# Patient Record
Sex: Female | Born: 1965 | Race: White | Hispanic: No | Marital: Married | Smoking: Never smoker
Health system: Southern US, Community
[De-identification: ages and names within clinical notes are randomized; demographics above are authoritative.]

## PROBLEM LIST (undated history)

## (undated) DIAGNOSIS — M1990 Unspecified osteoarthritis, unspecified site: Secondary | ICD-10-CM

## (undated) DIAGNOSIS — D649 Anemia, unspecified: Secondary | ICD-10-CM

## (undated) DIAGNOSIS — Z8489 Family history of other specified conditions: Secondary | ICD-10-CM

## (undated) DIAGNOSIS — D219 Benign neoplasm of connective and other soft tissue, unspecified: Secondary | ICD-10-CM

---

## 1988-04-22 HISTORY — PX: OTHER SURGICAL HISTORY: SHX169

## 2004-01-29 ENCOUNTER — Emergency Department: Payer: Self-pay | Admitting: Emergency Medicine

## 2004-05-11 ENCOUNTER — Ambulatory Visit: Payer: Self-pay | Admitting: Unknown Physician Specialty

## 2005-01-22 ENCOUNTER — Ambulatory Visit: Payer: Self-pay | Admitting: Unknown Physician Specialty

## 2005-02-06 ENCOUNTER — Ambulatory Visit: Payer: Self-pay | Admitting: Unknown Physician Specialty

## 2006-02-12 ENCOUNTER — Ambulatory Visit: Payer: Self-pay

## 2007-08-20 ENCOUNTER — Encounter: Payer: Self-pay | Admitting: Family Medicine

## 2007-08-21 ENCOUNTER — Encounter: Payer: Self-pay | Admitting: Family Medicine

## 2009-10-02 ENCOUNTER — Emergency Department: Payer: Self-pay | Admitting: Emergency Medicine

## 2010-04-05 ENCOUNTER — Ambulatory Visit: Payer: Self-pay

## 2010-08-06 ENCOUNTER — Ambulatory Visit: Payer: Self-pay | Admitting: Internal Medicine

## 2011-06-06 ENCOUNTER — Ambulatory Visit: Payer: Self-pay

## 2012-04-22 HISTORY — PX: ESOPHAGOGASTRODUODENOSCOPY: SHX1529

## 2012-04-22 HISTORY — PX: CHOLECYSTECTOMY: SHX55

## 2012-06-09 ENCOUNTER — Ambulatory Visit: Payer: Self-pay

## 2012-07-16 ENCOUNTER — Ambulatory Visit: Payer: Self-pay | Admitting: Specialist

## 2012-07-21 ENCOUNTER — Ambulatory Visit: Payer: Self-pay | Admitting: Specialist

## 2012-07-21 LAB — CBC WITH DIFFERENTIAL/PLATELET
Basophil #: 0 10*3/uL (ref 0.0–0.1)
Basophil %: 0.2 %
Eosinophil #: 0.2 10*3/uL (ref 0.0–0.7)
Eosinophil %: 3.1 %
HCT: 40.6 % (ref 35.0–47.0)
HGB: 13.9 g/dL (ref 12.0–16.0)
Lymphocyte #: 1.4 10*3/uL (ref 1.0–3.6)
Lymphocyte %: 22.9 %
MCH: 30.1 pg (ref 26.0–34.0)
MCHC: 34.4 g/dL (ref 32.0–36.0)
MCV: 88 fL (ref 80–100)
Monocyte #: 0.4 x10 3/mm (ref 0.2–0.9)
Monocyte %: 6.5 %
Neutrophil #: 4.1 10*3/uL (ref 1.4–6.5)
Neutrophil %: 67.3 %
Platelet: 334 10*3/uL (ref 150–440)
RBC: 4.63 10*6/uL (ref 3.80–5.20)
RDW: 13.6 % (ref 11.5–14.5)
WBC: 6.2 10*3/uL (ref 3.6–11.0)

## 2012-07-21 LAB — PROTIME-INR
INR: 1
Prothrombin Time: 13.4 secs (ref 11.5–14.7)

## 2012-07-21 LAB — IRON AND TIBC
Iron Bind.Cap.(Total): 319 ug/dL (ref 250–450)
Iron Saturation: 27 %
Iron: 86 ug/dL (ref 50–170)
Unbound Iron-Bind.Cap.: 233 ug/dL

## 2012-07-21 LAB — COMPREHENSIVE METABOLIC PANEL
Albumin: 3.5 g/dL (ref 3.4–5.0)
Alkaline Phosphatase: 96 U/L (ref 50–136)
Anion Gap: 5 — ABNORMAL LOW (ref 7–16)
BUN: 11 mg/dL (ref 7–18)
Bilirubin,Total: 0.5 mg/dL (ref 0.2–1.0)
Calcium, Total: 9 mg/dL (ref 8.5–10.1)
Chloride: 103 mmol/L (ref 98–107)
Co2: 29 mmol/L (ref 21–32)
Creatinine: 0.84 mg/dL (ref 0.60–1.30)
EGFR (African American): >60
EGFR (Non-African Amer.): >60
Glucose: 98 mg/dL (ref 65–99)
Osmolality: 273 (ref 275–301)
Potassium: 4 mmol/L (ref 3.5–5.1)
SGOT(AST): 15 U/L (ref 15–37)
SGPT (ALT): 20 U/L (ref 12–78)
Sodium: 137 mmol/L (ref 136–145)
Total Protein: 7.3 g/dL (ref 6.4–8.2)

## 2012-07-21 LAB — MAGNESIUM: Magnesium: 1.6 mg/dL — ABNORMAL LOW

## 2012-07-21 LAB — AMYLASE: Amylase: 31 U/L (ref 25–115)

## 2012-07-21 LAB — PHOSPHORUS: Phosphorus: 3.6 mg/dL (ref 2.5–4.9)

## 2012-07-21 LAB — FOLATE: Folic Acid: 36.9 ng/mL (ref 3.1–100.0)

## 2012-07-21 LAB — APTT: Activated PTT: 28.8 secs (ref 23.6–35.9)

## 2012-07-21 LAB — LIPASE, BLOOD: Lipase: 90 U/L (ref 73–393)

## 2012-07-21 LAB — HCG, QUANTITATIVE, PREGNANCY: Beta Hcg, Quant.: <1 m[IU]/mL — ABNORMAL LOW

## 2012-07-21 LAB — BILIRUBIN, DIRECT: Bilirubin, Direct: 0.1 mg/dL (ref 0.00–0.20)

## 2012-07-21 LAB — FERRITIN: Ferritin (ARMC): 14 ng/mL (ref 8–388)

## 2012-07-21 LAB — HEMOGLOBIN A1C: Hemoglobin A1C: 5.7 % (ref 4.2–6.3)

## 2012-07-21 LAB — TSH: Thyroid Stimulating Horm: 2.48 u[IU]/mL

## 2012-08-20 ENCOUNTER — Ambulatory Visit: Payer: Self-pay | Admitting: Surgery

## 2012-08-21 LAB — PATHOLOGY REPORT

## 2012-09-04 ENCOUNTER — Ambulatory Visit: Payer: Self-pay | Admitting: Specialist

## 2012-12-07 ENCOUNTER — Ambulatory Visit: Payer: Self-pay | Admitting: Gastroenterology

## 2012-12-08 LAB — PATHOLOGY REPORT

## 2013-01-13 ENCOUNTER — Ambulatory Visit: Payer: Self-pay | Admitting: Specialist

## 2013-01-13 LAB — MAGNESIUM: Magnesium: 2 mg/dL

## 2013-01-19 ENCOUNTER — Inpatient Hospital Stay: Payer: Self-pay | Admitting: Specialist

## 2013-01-19 LAB — TROPONIN I: Troponin-I: <0.02 ng/mL

## 2013-01-19 LAB — CK: CK, Total: 134 U/L (ref 21–215)

## 2013-01-19 LAB — CK-MB: CK-MB: 1.7 ng/mL (ref 0.5–3.6)

## 2013-01-20 LAB — CBC WITH DIFFERENTIAL/PLATELET
Basophil #: 0 10*3/uL (ref 0.0–0.1)
Basophil %: 0.2 %
Eosinophil #: 0 10*3/uL (ref 0.0–0.7)
Eosinophil %: 0.1 %
HCT: 37.6 % (ref 35.0–47.0)
HGB: 12.4 g/dL (ref 12.0–16.0)
Lymphocyte #: 1.3 10*3/uL (ref 1.0–3.6)
Lymphocyte %: 9.3 %
MCH: 28.6 pg (ref 26.0–34.0)
MCHC: 32.9 g/dL (ref 32.0–36.0)
MCV: 87 fL (ref 80–100)
Monocyte #: 0.9 x10 3/mm (ref 0.2–0.9)
Monocyte %: 6.4 %
Neutrophil #: 11.4 10*3/uL — ABNORMAL HIGH (ref 1.4–6.5)
Neutrophil %: 84 %
Platelet: 340 10*3/uL (ref 150–440)
RBC: 4.32 10*6/uL (ref 3.80–5.20)
RDW: 15.8 % — ABNORMAL HIGH (ref 11.5–14.5)
WBC: 13.6 10*3/uL — ABNORMAL HIGH (ref 3.6–11.0)

## 2013-01-20 LAB — BASIC METABOLIC PANEL
Anion Gap: 8 (ref 7–16)
BUN: 9 mg/dL (ref 7–18)
Calcium, Total: 7.7 mg/dL — ABNORMAL LOW (ref 8.5–10.1)
Chloride: 111 mmol/L — ABNORMAL HIGH (ref 98–107)
Co2: 21 mmol/L (ref 21–32)
Creatinine: 0.71 mg/dL (ref 0.60–1.30)
EGFR (African American): >60
EGFR (Non-African Amer.): >60
Glucose: 95 mg/dL (ref 65–99)
Osmolality: 278 (ref 275–301)
Potassium: 4.2 mmol/L (ref 3.5–5.1)
Sodium: 140 mmol/L (ref 136–145)

## 2013-01-20 LAB — MAGNESIUM: Magnesium: 1.7 mg/dL — ABNORMAL LOW

## 2013-01-20 LAB — PHOSPHORUS: Phosphorus: 2.7 mg/dL (ref 2.5–4.9)

## 2013-01-20 LAB — ALBUMIN: Albumin: 2.9 g/dL — ABNORMAL LOW (ref 3.4–5.0)

## 2013-02-11 ENCOUNTER — Ambulatory Visit: Payer: Self-pay | Admitting: Specialist

## 2013-04-22 HISTORY — PX: GASTRIC BYPASS: SHX52

## 2013-06-11 ENCOUNTER — Ambulatory Visit: Payer: Self-pay

## 2014-04-08 ENCOUNTER — Ambulatory Visit: Payer: Self-pay | Admitting: Obstetrics and Gynecology

## 2014-08-12 NOTE — Op Note (Signed)
PATIENT NAME:  Annette Aguilar, Annette Aguilar MR#:  [id number] DATE OF BIRTH:  Jul 30, 1965  DATE OF PROCEDURE:  01/19/2013  PREOPERATIVE DIAGNOSIS:  Morbid obesity.   POSTOPERATIVE DIAGNOSIS:  Morbid obesity.   PROCEDURE:  Laparoscopic Roux-en-Y gastric bypass.   SURGEON:  Kreg Shropshire, M.D.  ASSISTANT:  Valaria Good, PA-C.  COMPLICATIONS:  None.   ANESTHESIA:  General endotracheal.   FINDINGS:  No significant hiatal hernia.   CLINICAL HISTORY:  See H and P.   DETAILS OF THE PROCEDURE:  The patient was taken to the operating room and placed on the operating table in the supine position.  Appropriate monitors and supplemental oxygen were delivered.  Broad-spectrum IV antibiotics were administered.  Sequential stockings were placed.  The abdomen was prepped and draped in the usual sterile fashion after the smooth induction of general anesthesia.  The abdomen was accessed using a 5-ml optical trocar in the left upper quadrant.  Pneumoperitoneum was established without difficulty.  Multiple other ports were placed in preparation for gastric bypass.  The small bowel was identified at the ligament of Treitz and run for 50 cm distal to that point.  It was then divided using an Echelon white load stapler reinforced with seam guard.  The Harmonic scalpel was used to take down the mesentery to its base.  A 100 cm Roux limb, 50 cm biliopancreatic limb was then created and a side-to-side jejunojejunostomy was created in the following fashion.  A tacking stitch was used using 2-0 Surgidac suture.  An enterotomy was made in both limbs and the Echelon white load stapler was inserted to its hub, clamped, and fired for its full 6-cm length.  The ensuing defect was closed using interrupted sutures to retract the enterotomy up and stapled across with the Echelon blue-load stapler with good effect.  A baby's butt stitch was placed times one using 2-0 Surgidac suture.  A stay suture was placed anteriorly using a 2-0 Surgidac suture and  the mesenteric defect was closed using running 2-0 Surgidac suture.  The omentum was divided then in the midline to create a path for the Roux limb to be lifted and elevated up to the gastric pouch.  The liver retractor was placed and the liver was retracted anteriorly and cephalad.  The patient was placed in steep reverse Trendelenburg after the Roux limb was tacked to the underside of the stomach using a 2-0 Surgidac suture.  All structures were removed from the stomach.  The pars flaccida was taken down using a Harmonic scalpel to the second gastric vein.  At that point the Echelon gold load reinforced with seam guard was used to fire multiple times creating a small 30 to 45-ml pouch.  Hemostasis was excellent.  The posterior pouch was then freed of all fibrofatty tissue in preparation for anastomosis.  The small bowel Roux limb was then mobilized up into the upper abdomen and tacked to the left side of the pouch with an interrupted 2-0 Surgidac suture.  Enterotomies were made in the gastric pouch and in the small bowel and the Echelon blue load stapler was inserted to 28 mm and clamped.  This was then fired and the ensuing defect was closed using a layer of 2-0 Polysorb suture times one.  A 34 French blunt-tipped bougie was inserted trans-orally down past the anastomosis and the second layer of closure occurred with two running 2-0 Polysorb sutures from either side, creating a two-layer anastomosis.  The bougie was removed and the proximal Roux limb  was clamped.  This was submerged underneath saline and endoscope was placed trans-orally and guided all the way down to the anastomosis without difficulty.  The anastomosis was widely patent and hemostasis was excellent.  No leak was present under high-pressure insufflation with the proximal Roux limb clamp.  The endoscopes were removed and I re-gowned and re-gloved.  We took the omentum and draped that over the top of the anastomosis and sutured it in place using a  2-0 Surgidac suture.  The Peterson's defect was then closed using running 2-0 Surgidac suture. A drain was placed in the left upper quadrant over the anastomosis of [place].  The liver retractor and trocars were removed without incident.  No bleeding occurred from any of these sites.  The wounds were then closed using 4-0 Vicryl and Dermabond.  The patient tolerated the procedure well, arrived in the recovery room in stable condition, and will be admitted to my care.   ____________________________ Kreg Shropshire, MD jb:ce D: 01/19/2013 12:58:01 ET T: 01/19/2013 13:04:18 ET JOB#: [id number]  cc: Kreg Shropshire, MD, <Dictator> Wille Glaser Langley Adie MD ELECTRONICALLY SIGNED 01/20/2013 14:00

## 2014-08-12 NOTE — Op Note (Signed)
PATIENT NAME:  Annette Aguilar, Annette Aguilar MR#:  [id number] DATE OF BIRTH:  September 21, 1965  DATE OF PROCEDURE:  08/20/2012  PREOPERATIVE DIAGNOSES: Chronic cholecystitis, cholelithiasis.   POSTOPERATIVE DIAGNOSES: Chronic cholecystitis, cholelithiasis.   PROCEDURES: Laparoscopic cholecystectomy; cholangiogram.   SURGEON: Rochel Brome, M.D.   ANESTHESIA: General.   INDICATIONS: This 49 year old female has a history of epigastric pain, with ultrasound findings of gallstones, also physical findings of morbid obesity. Surgery was recommended for definitive treatment.   DESCRIPTION OF PROCEDURE: The patient was placed on the operating table in the supine position under general anesthesia. The abdomen was prepared with ChloraPrep and draped in a sterile manner.   A short incision was made in the inferior aspect of the umbilicus and carried down to the deep fascia, which was grasped with a laryngeal hook and elevated. A Veress needle was inserted, aspirated, and irrigated with a saline solution. Next, the peritoneal cavity was insufflated with carbon dioxide. However, the pressures were high, and we determined that the Veress needle was not all the way through the fascia. I did elect to use the 11 mm trocar in which the laparoscope was inserted into the trocar and the fascia was elevated with  a laryngeal hook and with the laparoscopic vision, the trocar was advanced down through the fascia and into the peritoneal cavity.   Next, the peritoneal cavity was insufflated with carbon dioxide. The obturator was removed. The laparoscope was inserted, viewing the peritoneal cavity. The liver appeared normal.   Next, an incision was made in the epigastrium slightly to the right of the midline to introduce a 10 mm cannula using the Veress needle technique. Two incisions were made in the lateral aspect of the right upper quadrant to introduce 2 5-mm cannulas.    The patient was placed in the reverse Trendelenburg position and  turned some degrees to the left. The gallbladder was found to have a white appearance, partially contracted, was retracted towards the right shoulder. Multiple adhesions were taken down with blunt dissection. The pouch of [place] was retracted inferiorly and laterally. It was necessary to change to the  30 degree scope for better exposure. The cystic duct was dissected free from surrounding structures. The cystic artery was dissected free from surrounding structures. The neck of the gallbladder was mobilized with incision of the visceral peritoneum. A critical view of safety was demonstrated. An Endo clip was placed across the cystic duct adjacent to the neck of the gallbladder. An incision was made in the cystic duct to introduce a Reddick catheter. Half-strength Conray-60 dye was injected as the cholangiogram was done under fluoroscopy viewing the biliary tree and flow of dye into the duodenum. There were multiple overlying shadows so that the view of the distal common bile duct was suboptimal. There appeared to be no abnormalities. The Reddick catheter was removed. The cystic duct was doubly ligated with Endo clips and divided. The cystic artery was controlled with double Endo clips and divided. The gallbladder was mobilized from the liver with use of hook and cautery. There was one other small branch of the cystic artery which was controlled with Endo clips and divided. The gallbladder was further dissected with somewhat tedious dissection, though there was some scant drainage of bile which was aspirated as there was a small perforation of the gallbladder, and this was aspirated and subsequently resolved. The gallbladder was completely detached and was placed into an EndoCatch bag. The gallbladder bed was examined. Hemostasis appeared to be intact. The site was  irrigated and aspirated and subsequently the laparoscope was moved to the epigastric port and the EndoCatch bag was inserted through the umbilical port  and the gallbladder put into the EndoCatch bag and pulled it up into the incision. The gallbladder was full of gallstones. It was necessary to lengthen the skin incision by approximately 1 cm and also lengthen the fascial incision by approximately 1 cm, and with additional traction the gallbladder was removed. It was submitted in formalin with stones for routine pathology. The right upper quadrant was further inspected, irrigated and aspirated. Hemostasis was intact. The cannulas were removed, seeing no significant bleeding from the cannula sites. Next, the fascial defect at the umbilical incision was closed with 0 Maxon figure-of-eight sutures, placing 2 figure-of-eight sutures, obliterating the fascial defect. Next, the wounds were closed with interrupted 5-0 chromic subcuticular suture, benzoin and Steri-Strips. Dressings were applied, with paper tape. The patient tolerated surgery satisfactorily and was then prepared for transfer to the recovery room.     ____________________________ Lenna Sciara. Rochel Brome, MD jws:dm D: 08/20/2012 13:32:45 ET T: 08/20/2012 13:51:09 ET JOB#: [id number]  cc: Loreli Dollar, MD, <Dictator> Loreli Dollar MD ELECTRONICALLY SIGNED 08/21/2012 20:13

## 2015-10-31 ENCOUNTER — Other Ambulatory Visit: Payer: Self-pay | Admitting: Obstetrics and Gynecology

## 2015-10-31 DIAGNOSIS — N6310 Unspecified lump in the right breast, unspecified quadrant: Secondary | ICD-10-CM

## 2015-11-09 ENCOUNTER — Ambulatory Visit: Payer: Self-pay

## 2015-11-09 ENCOUNTER — Other Ambulatory Visit: Payer: Self-pay

## 2015-11-17 ENCOUNTER — Ambulatory Visit
Admission: RE | Admit: 2015-11-17 | Discharge: 2015-11-17 | Disposition: A | Discharge status: Home / Self Care | Payer: Managed Care, Other (non HMO) | Source: Ambulatory Visit | Attending: Obstetrics and Gynecology | Admitting: Obstetrics and Gynecology

## 2015-11-17 ENCOUNTER — Encounter: Payer: Self-pay | Admitting: Radiology

## 2015-11-17 DIAGNOSIS — N6310 Unspecified lump in the right breast, unspecified quadrant: Secondary | ICD-10-CM

## 2015-11-17 DIAGNOSIS — N63 Unspecified lump in breast: Secondary | ICD-10-CM | POA: Diagnosis not present

## 2016-11-05 ENCOUNTER — Other Ambulatory Visit: Payer: Self-pay | Admitting: Obstetrics and Gynecology

## 2016-11-05 DIAGNOSIS — Z1231 Encounter for screening mammogram for malignant neoplasm of breast: Secondary | ICD-10-CM

## 2016-11-18 ENCOUNTER — Encounter: Payer: Self-pay | Admitting: Radiology

## 2016-11-18 ENCOUNTER — Ambulatory Visit
Admission: RE | Admit: 2016-11-18 | Discharge: 2016-11-18 | Disposition: A | Discharge status: Home / Self Care | Payer: Managed Care, Other (non HMO) | Source: Ambulatory Visit | Attending: Obstetrics and Gynecology | Admitting: Obstetrics and Gynecology

## 2016-11-18 DIAGNOSIS — Z1231 Encounter for screening mammogram for malignant neoplasm of breast: Secondary | ICD-10-CM | POA: Insufficient documentation

## 2017-02-06 DIAGNOSIS — G44309 Post-traumatic headache, unspecified, not intractable: Secondary | ICD-10-CM | POA: Insufficient documentation

## 2017-04-21 ENCOUNTER — Encounter: Payer: Self-pay | Admitting: *Deleted

## 2017-04-23 ENCOUNTER — Ambulatory Visit
Admission: RE | Admit: 2017-04-23 | Discharge: 2017-04-23 | Disposition: A | Discharge status: Home / Self Care | Payer: 59 | Source: Ambulatory Visit | Attending: Internal Medicine | Admitting: Internal Medicine

## 2017-04-23 ENCOUNTER — Ambulatory Visit: Payer: 59 | Admitting: Registered Nurse

## 2017-04-23 ENCOUNTER — Encounter: Payer: Self-pay | Admitting: *Deleted

## 2017-04-23 ENCOUNTER — Encounter
Admission: RE | Disposition: A | Discharge status: Home / Self Care | Payer: Self-pay | Source: Ambulatory Visit | Attending: Internal Medicine

## 2017-04-23 DIAGNOSIS — Z79899 Other long term (current) drug therapy: Secondary | ICD-10-CM | POA: Insufficient documentation

## 2017-04-23 DIAGNOSIS — K5730 Diverticulosis of large intestine without perforation or abscess without bleeding: Secondary | ICD-10-CM | POA: Diagnosis not present

## 2017-04-23 DIAGNOSIS — Z9884 Bariatric surgery status: Secondary | ICD-10-CM | POA: Insufficient documentation

## 2017-04-23 DIAGNOSIS — Z9049 Acquired absence of other specified parts of digestive tract: Secondary | ICD-10-CM | POA: Insufficient documentation

## 2017-04-23 DIAGNOSIS — K640 First degree hemorrhoids: Secondary | ICD-10-CM | POA: Insufficient documentation

## 2017-04-23 DIAGNOSIS — Z8371 Family history of colonic polyps: Secondary | ICD-10-CM | POA: Insufficient documentation

## 2017-04-23 DIAGNOSIS — Z1211 Encounter for screening for malignant neoplasm of colon: Secondary | ICD-10-CM | POA: Diagnosis not present

## 2017-04-23 DIAGNOSIS — I10 Essential (primary) hypertension: Secondary | ICD-10-CM | POA: Insufficient documentation

## 2017-04-23 HISTORY — DX: Benign neoplasm of connective and other soft tissue, unspecified: D21.9

## 2017-04-23 HISTORY — PX: COLONOSCOPY WITH PROPOFOL: SHX5780

## 2017-04-23 SURGERY — COLONOSCOPY WITH PROPOFOL
Anesthesia: General

## 2017-04-23 MED ORDER — SODIUM CHLORIDE 0.9 % IV SOLN
INTRAVENOUS | Status: DC
  Administered 2017-04-23: 1000 mL via INTRAVENOUS

## 2017-04-23 MED ORDER — MIDAZOLAM HCL 2 MG/2ML IJ SOLN
INTRAMUSCULAR | Status: AC
  Filled 2017-04-23: qty 2

## 2017-04-23 MED ORDER — LIDOCAINE HCL (PF) 2 % IJ SOLN
INTRAMUSCULAR | Status: AC
  Filled 2017-04-23: qty 10

## 2017-04-23 MED ORDER — PROPOFOL 500 MG/50ML IV EMUL
INTRAVENOUS | Status: DC | PRN
  Administered 2017-04-23: 125 ug/kg/min via INTRAVENOUS

## 2017-04-23 MED ORDER — MIDAZOLAM HCL 2 MG/2ML IJ SOLN
INTRAMUSCULAR | Status: DC | PRN
  Administered 2017-04-23: 2 mg via INTRAVENOUS

## 2017-04-23 MED ORDER — PROPOFOL 10 MG/ML IV BOLUS
INTRAVENOUS | Status: DC | PRN
  Administered 2017-04-23: 40 mg via INTRAVENOUS
  Administered 2017-04-23: 50 mg via INTRAVENOUS

## 2017-04-23 MED ORDER — PROPOFOL 500 MG/50ML IV EMUL
INTRAVENOUS | Status: AC
  Filled 2017-04-23: qty 50

## 2017-04-23 NOTE — Anesthesia Post-op Follow-up Note (Signed)
Anesthesia QCDR form completed.        

## 2017-04-23 NOTE — Anesthesia Procedure Notes (Signed)
Date/Time: 04/23/2017 9:09 AM Performed by: Doreen Salvage, CRNA Pre-anesthesia Checklist: Patient identified, Emergency Drugs available, Suction available and Patient being monitored Patient Re-evaluated:Patient Re-evaluated prior to induction Oxygen Delivery Method: Nasal cannula Induction Type: IV induction Dental Injury: Teeth and Oropharynx as per pre-operative assessment  Comments: Nasal cannula with etCO2 monitoring

## 2017-04-23 NOTE — Transfer of Care (Signed)
Immediate Anesthesia Transfer of Care Note  Patient: Annette Aguilar  Procedure(s) Performed: Procedure(s): COLONOSCOPY WITH PROPOFOL (N/A)  Patient Location: PACU and Endoscopy Unit  Anesthesia Type:General  Level of Consciousness: sedated  Airway & Oxygen Therapy: Patient Spontanous Breathing and Patient connected to nasal cannula oxygen  Post-op Assessment: Report given to RN and Post -op Vital signs reviewed and stable  Post vital signs: Reviewed and stable  Last Vitals:  Vitals:   04/23/17 0835 04/23/17 0931  BP: (!) 186/94 (!) 99/56  Pulse: 80 88  Resp: 16 15  Temp: 36.4 C (!) (P) 36.4 C  SpO2: 211% 94%    Complications: No apparent anesthesia complications

## 2017-04-23 NOTE — H&P (Signed)
Outpatient short stay form Pre-procedure 04/23/2017 9:03 AM Taisei Bonnette K. Alice Reichert, M.D.  Primary Physician: Glenis Smoker, CNM  Reason for visit:  Colon cancer screening, FH colon polyps.  History of present illness:  Patient is a pleasant 52 year old female with a family history of polyps in both parents. Patient denies any abdominal pain, change in bowel habits rectal bleeding. This is her first colonoscopy.    Current Facility-Administered Medications:  .  0.9 %  sodium chloride infusion, , Intravenous, Continuous, [place], Benay Pike, MD, Last Rate: 20 mL/hr at 04/23/17 0850, 1,000 mL at 04/23/17 0850  Medications Prior to Admission  Medication Sig Dispense Refill Last Dose  . calcium carbonate (TUMS EX) 750 MG chewable tablet Chew 1 tablet by mouth every 2 (two) hours as needed for heartburn.     . Multiple Vitamin (MULTIVITAMIN) tablet Take 1 tablet by mouth daily.     Marland Kitchen VITAMIN D, CHOLECALCIFEROL, PO Take 1,000 Units by mouth daily.        No Known Allergies   Past Medical History:  Diagnosis Date  . Fibroids   . Hypertension     Review of systems:      Physical Exam  General appearance: alert, cooperative and appears stated age Resp: clear to auscultation bilaterally Cardio: regular rate and rhythm, S1, S2 normal, no murmur, click, rub or gallop GI: soft, non-tender; bowel sounds normal; no masses,  no organomegaly Extremities: extremities normal, atraumatic, no cyanosis or edema     Planned procedures: Colonoscopy. The patient understands the nature of the planned procedure, indications, risks, alternatives and potential complications including but not limited to bleeding, infection, perforation, damage to internal organs and possible oversedation/side effects from anesthesia. The patient agrees and gives consent to proceed.  Please refer to procedure notes for findings, recommendations and patient disposition/instructions.    Callaghan Laverdure K. Alice Reichert,  M.D. Gastroenterology 04/23/2017  9:03 AM

## 2017-04-23 NOTE — Op Note (Signed)
[facility] Gastroenterology Patient Name: Annette Aguilar Procedure Date: 04/23/2017 9:08 AM MRN: [id number] Account #: [id number] Date of Birth: 1965/11/25 Admit Type: Outpatient Age: 52 Room: [facility] ENDO ROOM 1 Gender: Female Note Status: Finalized Procedure:            Colonoscopy Indications:          Colon cancer screening in patient at increased risk:                        Family history of 1st-degree relative with colon polyps Providers:            Benay Pike. Alice Reichert MD, MD Referring MD:         Catheryn Bacon CNM, CNM (Referring MD) Medicines:            Propofol per Anesthesia Complications:        No immediate complications. Procedure:            Pre-Anesthesia Assessment:                       - The risks and benefits of the procedure and the                        sedation options and risks were discussed with the                        patient. All questions were answered and informed                        consent was obtained.                       - Patient identification and proposed procedure were                        verified prior to the procedure by the nurse. The                        procedure was verified in the procedure room.                       - ASA Grade Assessment: II - A patient with mild                        systemic disease.                       - After reviewing the risks and benefits, the patient                        was deemed in satisfactory condition to undergo the                        procedure.                       After obtaining informed consent, the colonoscope was                        passed under direct vision. Throughout the procedure,  the patient's blood pressure, pulse, and oxygen                        saturations were monitored continuously. The                        Colonoscope was introduced through the anus and                        advanced to the the cecum, identified by  appendiceal                        orifice and ileocecal valve. The colonoscopy was                        performed without difficulty. The patient tolerated the                        procedure well. The quality of the bowel preparation                        was good. Findings:      The perianal and digital rectal examinations were normal. Pertinent       negatives include normal sphincter tone and no palpable rectal lesions.      A few small-mouthed diverticula were found in the sigmoid colon.      Non-bleeding internal hemorrhoids were found during retroflexion. The       hemorrhoids were Grade I (internal hemorrhoids that do not prolapse).      The exam was otherwise without abnormality. Impression:           - Diverticulosis in the sigmoid colon.                       - Non-bleeding internal hemorrhoids.                       - The examination was otherwise normal.                       - No specimens collected. Recommendation:       - Patient has a contact number available for                        emergencies. The signs and symptoms of potential                        delayed complications were discussed with the patient.                        Return to normal activities tomorrow. Written discharge                        instructions were provided to the patient.                       - Resume previous diet.                       - Continue present medications.                       -  Repeat colonoscopy in 5 years for screening purposes.                       - Return to GI office PRN. Procedure Code(s):    --- Professional ---                       B8466, Colorectal cancer screening; colonoscopy on                        individual at high risk Diagnosis Code(s):    --- Professional ---                       Z83.71, Family history of colonic polyps                       K64.0, First degree hemorrhoids                       K57.30, Diverticulosis of large intestine without                         perforation or abscess without bleeding CPT copyright 2016 [employer] Medical [employer]. All rights reserved. The codes documented in this report are preliminary and upon coder review may  be revised to meet current compliance requirements. Efrain Sella MD, MD 04/23/2017 9:29:22 AM This report has been signed electronically. Number of Addenda: 0 Note Initiated On: 04/23/2017 9:08 AM Scope Withdrawal Time: 0 hours 6 minutes 32 seconds  Total Procedure Duration: 0 hours 12 minutes 2 seconds       [facility]

## 2017-04-23 NOTE — Anesthesia Preprocedure Evaluation (Signed)
Anesthesia Evaluation  Patient identified by MRN, date of birth, ID band Patient awake    Reviewed: Allergy & Precautions, H&P , NPO status , Patient's Chart, lab work & pertinent test results, reviewed documented beta blocker date and time   Airway Mallampati: II   Neck ROM: full    Dental  (+) Poor Dentition   Pulmonary neg pulmonary ROS,    Pulmonary exam normal        Cardiovascular Exercise Tolerance: Good hypertension, negative cardio ROS Normal cardiovascular exam Rhythm:regular Rate:Normal     Neuro/Psych negative neurological ROS  negative psych ROS   GI/Hepatic negative GI ROS, Neg liver ROS,   Endo/Other  negative endocrine ROS  Renal/GU negative Renal ROS  negative genitourinary   Musculoskeletal   Abdominal   Peds  Hematology negative hematology ROS (+)   Anesthesia Other Findings Past Medical History: No date: Fibroids No date: Hypertension Past Surgical History: No date: CHOLECYSTECTOMY No date: ESOPHAGOGASTRODUODENOSCOPY No date: GASTRIC BYPASS No date: Left Wrist Surgery   Reproductive/Obstetrics negative OB ROS                             Anesthesia Physical Anesthesia Plan  ASA: II  Anesthesia Plan: General   Post-op Pain Management:    Induction:   PONV Risk Score and Plan:   Airway Management Planned:   Additional Equipment:   Intra-op Plan:   Post-operative Plan:   Informed Consent: I have reviewed the patients History and Physical, chart, labs and discussed the procedure including the risks, benefits and alternatives for the proposed anesthesia with the patient or authorized representative who has indicated his/her understanding and acceptance.   Dental Advisory Given  Plan Discussed with: CRNA  Anesthesia Plan Comments:         Anesthesia Quick Evaluation

## 2017-04-23 NOTE — Interval H&P Note (Signed)
History and Physical Interval Note:  04/23/2017 9:05 AM  Annette Aguilar  has presented today for surgery, with the diagnosis of FH POLYPS  The various methods of treatment have been discussed with the patient and family. After consideration of risks, benefits and other options for treatment, the patient has consented to  Procedure(s): COLONOSCOPY WITH PROPOFOL (N/A) as a surgical intervention .  The patient's history has been reviewed, patient examined, no change in status, stable for surgery.  I have reviewed the patient's chart and labs.  Questions were answered to the patient's satisfaction.     [place], [place]

## 2017-04-24 ENCOUNTER — Encounter: Payer: Self-pay | Admitting: Internal Medicine

## 2017-04-24 NOTE — Anesthesia Postprocedure Evaluation (Signed)
Anesthesia Post Note  Patient: CAOIMHE DAMRON  Procedure(s) Performed: COLONOSCOPY WITH PROPOFOL (N/A )  Patient location during evaluation: PACU Anesthesia Type: General Level of consciousness: awake and alert Pain management: pain level controlled Vital Signs Assessment: post-procedure vital signs reviewed and stable Respiratory status: spontaneous breathing, nonlabored ventilation, respiratory function stable and patient connected to nasal cannula oxygen Cardiovascular status: blood pressure returned to baseline and stable Postop Assessment: no apparent nausea or vomiting Anesthetic complications: no     Last Vitals:  Vitals:   04/23/17 0951 04/23/17 1001  BP: 131/84 (!) 142/82  Pulse: 75 76  Resp: 17 15  Temp:    SpO2: 100% 100%    Last Pain:  Vitals:   04/23/17 0931  TempSrc: Tympanic  PainSc: [place]

## 2017-06-12 ENCOUNTER — Encounter
Admission: RE | Admit: 2017-06-12 | Discharge: 2017-06-12 | Disposition: A | Discharge status: Home / Self Care | Payer: 59 | Source: Ambulatory Visit | Attending: Allergy and Immunology | Admitting: Allergy and Immunology

## 2017-06-12 ENCOUNTER — Other Ambulatory Visit: Payer: Self-pay

## 2017-06-12 HISTORY — DX: Family history of other specified conditions: Z84.89

## 2017-06-12 NOTE — Patient Instructions (Addendum)
Your procedure is scheduled on Friday, MARCH 1st  :Report to [place]  To find out your arrival time please call [phone] between 1PM - 3PM on  Thursday, February 28  Remember: Instructions that are not followed completely may result in serious  medical risk, up to and including death, or upon the discretion of your surgeon  and anesthesiologist your surgery may need to be rescheduled.     _X__ 1. Do not eat food after midnight the night before your procedure.                 No gum chewing, lozengers or hard candies.                   You may drink clear liquids up to 2 hours before you are scheduled to arrive for your surgery-                  DO not drink clear liquids within 2 hours of the start of your surgery.                  Clear Liquids include:  water, apple juice without pulp, clear carbohydrate                 drink such as Clearfast of Gartorade, Black Coffee or Tea (Do not add                 anything to coffee or tea).  __X__2.  On the morning of surgery brush your teeth with toothpaste and water,                      you may rinse your mouth with mouthwash if you wish.                           Do not swallow any toothpaste of mouthwash.     _X__ 3.  No Alcohol for 24 hours before or after surgery.   _X__ 4.  Do Not Smoke or use e-cigarettes For 24 Hours Prior to Your Surgery.                 Do not use any chewable tobacco products for at least 6 hours prior to                 surgery.  ____  5.  Bring all medications with you on the day of surgery if instructed.   ____  6.  Notify your doctor if there is any change in your medical condition      (cold, fever, infections).     Do not wear jewelry, make-up, hairpins, clips or nail polish. Do not wear lotions, powders, or perfumes. You may wear deodorant. Do not shave 48 hours prior to surgery. Men may shave face and neck. Do not bring valuables to the  hospital.    [facility] is not responsible for any belongings or valuables.  Contacts, dentures or bridgework may not be worn into surgery. Leave your suitcase in the car. After surgery it may be brought to your room. For patients admitted to the hospital, discharge time is determined by your treatment team.   Patients discharged the day of surgery will not be allowed to drive home.   Please read over the following fact sheets that you were given:  INSTRUCTIONS PROVIDED VIA PHONE     ____ Take these medicines the morning of surgery with A SIP OF WATER:    1. NONE  2.   3.     ____ Fleet Enema (as directed)   _X___ TAKE A SHOWER THE NIGHT BEFORE SURGERY / OR MORNING OF  ____ Use inhalers on the day of surgery  __X__ Stop ASPIRIN PRODUCTS ON 06/13/2017                THIS INCLUDES EXCEDRIN / GOODYS POWDER / BC POWDER  __X__ Stop Anti-inflammatories on 06/13/2017                THIS INCLUDES IBUPROFEN / MOTRIN / ALEVE / ADVIL / NAPROSYN   __X__ Stop supplements until after surgery.  THIS INCLUDES PROBIOTICS, MULTIVITAMINS                       AND MILK THISTLE  ____ Bring C-Pap to the hospital.   IF YOU END UP TAKING NARCOTICS FOR ANY REGULAR BASIS,            PLEASE HAVE STOOL SOFTENERS TO PREVENT CONSTIPATION.  WEAR LOOSE AND COMFORTABLE CLOTHES FOR SURGERY.

## 2017-06-17 ENCOUNTER — Encounter
Admission: RE | Admit: 2017-06-17 | Discharge: 2017-06-17 | Disposition: A | Discharge status: Home / Self Care | Payer: 59 | Source: Ambulatory Visit | Attending: Obstetrics & Gynecology | Admitting: Obstetrics & Gynecology

## 2017-06-17 DIAGNOSIS — N858 Other specified noninflammatory disorders of uterus: Secondary | ICD-10-CM | POA: Diagnosis not present

## 2017-06-17 DIAGNOSIS — Z9884 Bariatric surgery status: Secondary | ICD-10-CM | POA: Diagnosis not present

## 2017-06-17 DIAGNOSIS — N950 Postmenopausal bleeding: Secondary | ICD-10-CM | POA: Diagnosis present

## 2017-06-17 LAB — CBC WITH DIFFERENTIAL/PLATELET
Basophils Absolute: 0 10*3/uL (ref 0–0.1)
Basophils Relative: 0 %
Eosinophils Absolute: 0.2 10*3/uL (ref 0–0.7)
Eosinophils Relative: 4 %
HCT: 37 % (ref 35.0–47.0)
Hemoglobin: 12.3 g/dL (ref 12.0–16.0)
Lymphocytes Relative: 28 %
Lymphs Abs: 1.3 10*3/uL (ref 1.0–3.6)
MCH: 28.7 pg (ref 26.0–34.0)
MCHC: 33.3 g/dL (ref 32.0–36.0)
MCV: 86.2 fL (ref 80.0–100.0)
Monocytes Absolute: 0.4 10*3/uL (ref 0.2–0.9)
Monocytes Relative: 9 %
Neutro Abs: 2.6 10*3/uL (ref 1.4–6.5)
Neutrophils Relative %: 59 %
Platelets: 326 10*3/uL (ref 150–440)
RBC: 4.29 MIL/uL (ref 3.80–5.20)
RDW: 15.5 % — ABNORMAL HIGH (ref 11.5–14.5)
WBC: 4.5 10*3/uL (ref 3.6–11.0)

## 2017-06-17 LAB — BASIC METABOLIC PANEL
Anion gap: 6 (ref 5–15)
BUN: 16 mg/dL (ref 6–20)
CO2: 29 mmol/L (ref 22–32)
Calcium: 8.6 mg/dL — ABNORMAL LOW (ref 8.9–10.3)
Chloride: 106 mmol/L (ref 101–111)
Creatinine, Ser: 0.58 mg/dL (ref 0.44–1.00)
GFR calc Af Amer: >60 mL/min (ref >60)
GFR calc non Af Amer: >60 mL/min (ref >60)
Glucose, Bld: 99 mg/dL (ref 65–99)
Potassium: 4 mmol/L (ref 3.5–5.1)
Sodium: 141 mmol/L (ref 135–145)

## 2017-06-17 LAB — TYPE AND SCREEN
ABO/RH(D): A POS
Antibody Screen: NEGATIVE

## 2017-06-20 ENCOUNTER — Ambulatory Visit
Admission: RE | Admit: 2017-06-20 | Discharge: 2017-06-20 | Disposition: A | Discharge status: Home / Self Care | Payer: 59 | Source: Ambulatory Visit | Attending: Obstetrics & Gynecology | Admitting: Obstetrics & Gynecology

## 2017-06-20 ENCOUNTER — Ambulatory Visit: Payer: 59 | Admitting: Anesthesiology

## 2017-06-20 ENCOUNTER — Other Ambulatory Visit: Payer: Self-pay

## 2017-06-20 ENCOUNTER — Encounter
Admission: RE | Disposition: A | Discharge status: Home / Self Care | Payer: Self-pay | Source: Ambulatory Visit | Attending: Obstetrics & Gynecology

## 2017-06-20 ENCOUNTER — Encounter: Payer: Self-pay | Admitting: *Deleted

## 2017-06-20 DIAGNOSIS — N858 Other specified noninflammatory disorders of uterus: Secondary | ICD-10-CM | POA: Insufficient documentation

## 2017-06-20 DIAGNOSIS — Z9884 Bariatric surgery status: Secondary | ICD-10-CM | POA: Insufficient documentation

## 2017-06-20 DIAGNOSIS — N950 Postmenopausal bleeding: Secondary | ICD-10-CM | POA: Insufficient documentation

## 2017-06-20 HISTORY — PX: HYSTEROSCOPY WITH D & C: SHX1775

## 2017-06-20 LAB — ABO/RH: ABO/RH(D): A POS

## 2017-06-20 LAB — POCT PREGNANCY, URINE: Preg Test, Ur: NEGATIVE

## 2017-06-20 SURGERY — DILATATION AND CURETTAGE /HYSTEROSCOPY
Anesthesia: General

## 2017-06-20 MED ORDER — SILVER NITRATE-POT NITRATE 75-25 % EX MISC
CUTANEOUS | Status: DC | PRN
  Administered 2017-06-20: 2

## 2017-06-20 MED ORDER — SUGAMMADEX SODIUM 200 MG/2ML IV SOLN
INTRAVENOUS | Status: AC
  Filled 2017-06-20: qty 2

## 2017-06-20 MED ORDER — FAMOTIDINE 20 MG PO TABS
20.0000 mg | ORAL_TABLET | Freq: Once | ORAL | Status: AC
  Administered 2017-06-20: 20 mg via ORAL

## 2017-06-20 MED ORDER — KETOROLAC TROMETHAMINE 30 MG/ML IJ SOLN
INTRAMUSCULAR | Status: DC | PRN
  Administered 2017-06-20: 30 mg via INTRAVENOUS

## 2017-06-20 MED ORDER — MIDAZOLAM HCL 2 MG/2ML IJ SOLN
INTRAMUSCULAR | Status: AC
  Filled 2017-06-20: qty 2

## 2017-06-20 MED ORDER — FENTANYL CITRATE (PF) 100 MCG/2ML IJ SOLN
INTRAMUSCULAR | Status: AC
  Filled 2017-06-20: qty 2

## 2017-06-20 MED ORDER — FENTANYL CITRATE (PF) 100 MCG/2ML IJ SOLN
INTRAMUSCULAR | Status: DC | PRN
  Administered 2017-06-20: 25 ug via INTRAVENOUS
  Administered 2017-06-20: 100 ug via INTRAVENOUS
  Administered 2017-06-20 (×2): 25 ug via INTRAVENOUS
  Administered 2017-06-20 (×2): 50 ug via INTRAVENOUS
  Administered 2017-06-20: 25 ug via INTRAVENOUS

## 2017-06-20 MED ORDER — ONDANSETRON HCL 4 MG/2ML IJ SOLN
INTRAMUSCULAR | Status: AC
  Filled 2017-06-20: qty 2

## 2017-06-20 MED ORDER — ONDANSETRON HCL 4 MG/2ML IJ SOLN
INTRAMUSCULAR | Status: DC | PRN
  Administered 2017-06-20: 4 mg via INTRAVENOUS

## 2017-06-20 MED ORDER — MIDAZOLAM HCL 2 MG/2ML IJ SOLN
INTRAMUSCULAR | Status: DC | PRN
  Administered 2017-06-20: 2 mg via INTRAVENOUS

## 2017-06-20 MED ORDER — KETOROLAC TROMETHAMINE 30 MG/ML IJ SOLN
INTRAMUSCULAR | Status: AC
  Filled 2017-06-20: qty 1

## 2017-06-20 MED ORDER — FAMOTIDINE 20 MG PO TABS
ORAL_TABLET | ORAL | Status: AC
  Filled 2017-06-20: qty 1

## 2017-06-20 MED ORDER — PROPOFOL 500 MG/50ML IV EMUL
INTRAVENOUS | Status: AC
  Filled 2017-06-20: qty 50

## 2017-06-20 MED ORDER — FENTANYL CITRATE (PF) 100 MCG/2ML IJ SOLN
25.0000 ug | INTRAMUSCULAR | Status: DC | PRN
  Administered 2017-06-20 (×5): 25 ug via INTRAVENOUS

## 2017-06-20 MED ORDER — FENTANYL CITRATE (PF) 100 MCG/2ML IJ SOLN
INTRAMUSCULAR | Status: AC
  Administered 2017-06-20: 25 ug via INTRAVENOUS
  Filled 2017-06-20: qty 2

## 2017-06-20 MED ORDER — PROPOFOL 10 MG/ML IV BOLUS
INTRAVENOUS | Status: AC
  Filled 2017-06-20: qty 20

## 2017-06-20 MED ORDER — PROPOFOL 500 MG/50ML IV EMUL
INTRAVENOUS | Status: DC | PRN
  Administered 2017-06-20: 100 ug/kg/min via INTRAVENOUS

## 2017-06-20 MED ORDER — LACTATED RINGERS IV SOLN
INTRAVENOUS | Status: DC
  Administered 2017-06-20 (×2): via INTRAVENOUS

## 2017-06-20 MED ORDER — FENTANYL CITRATE (PF) 250 MCG/5ML IJ SOLN
INTRAMUSCULAR | Status: AC
  Filled 2017-06-20: qty 5

## 2017-06-20 MED ORDER — ONDANSETRON HCL 4 MG/2ML IJ SOLN: 4.0000 mg | Freq: Once | INTRAMUSCULAR | Status: DC | PRN

## 2017-06-20 MED ORDER — PROPOFOL 10 MG/ML IV BOLUS
INTRAVENOUS | Status: DC | PRN
  Administered 2017-06-20: 40 mg via INTRAVENOUS
  Administered 2017-06-20: 30 mg via INTRAVENOUS

## 2017-06-20 SURGICAL SUPPLY — 16 items
BAG INFUSER PRESSURE 100CC (MISCELLANEOUS) ×2 IMPLANT
ELECT REM PT RETURN 9FT ADLT (ELECTROSURGICAL) ×2
ELECTRODE REM PT RTRN 9FT ADLT (ELECTROSURGICAL) ×1 IMPLANT
GLOVE PI ORTHOPRO 6.5 (GLOVE) ×1
GLOVE PI ORTHOPRO STRL 6.5 (GLOVE) ×1 IMPLANT
GLOVE SURG SYN 6.5 ES PF (GLOVE) ×6 IMPLANT
GOWN STRL REUS W/ TWL LRG LVL3 (GOWN DISPOSABLE) ×2 IMPLANT
GOWN STRL REUS W/TWL LRG LVL3 (GOWN DISPOSABLE) ×2
IV LACTATED RINGERS 1000ML (IV SOLUTION) ×2 IMPLANT
KIT TURNOVER CYSTO (KITS) ×2 IMPLANT
NEEDLE SPNL 22GX3.5 QUINCKE BK (NEEDLE) ×2 IMPLANT
PACK DNC HYST (MISCELLANEOUS) ×2 IMPLANT
PAD OB MATERNITY 4.3X12.25 (PERSONAL CARE ITEMS) ×2 IMPLANT
PAD PREP 24X41 OB/GYN DISP (PERSONAL CARE ITEMS) ×2 IMPLANT
SYR 10ML LL (SYRINGE) ×2 IMPLANT
TUBING CONNECTING 10 (TUBING) ×2 IMPLANT

## 2017-06-20 NOTE — Discharge Instructions (Signed)
You should expect to have some cramping and vaginal bleeding for about a week. This should taper off and subside, much like a period. If heavy bleeding continues or gets worse, you should contact the office for an earlier appointment.   Please call the office or physician on call for fever >101, severe pain, and heavy bleeding.   [phone]  NOTHING IN THE VAGINA FOR 2 WEEKS!!  Dr. Ward will discuss pathology results with you at your postop visit.    AMBULATORY SURGERY  DISCHARGE INSTRUCTIONS   1) The drugs that you were given will stay in your system until tomorrow so for the next 24 hours you should not:  A) Drive an automobile B) Make any legal decisions C) Drink any alcoholic beverage   2) You may resume regular meals tomorrow.  Today it is better to start with liquids and gradually work up to solid foods.  You may eat anything you prefer, but it is better to start with liquids, then soup and crackers, and gradually work up to solid foods.   3) Please notify your doctor immediately if you have any unusual bleeding, trouble breathing, redness and pain at the surgery site, drainage, fever, or pain not relieved by medication.    4) Additional Instructions:        Please contact your physician with any problems or Same Day Surgery at [phone], Monday through Friday 6 am to 4 pm, or [place] [facility] [place] Main number at [phone].  

## 2017-06-20 NOTE — Transfer of Care (Signed)
Immediate Anesthesia Transfer of Care Note  Patient: Annette Aguilar  Procedure(s) Performed: DILATATION AND CURETTAGE /HYSTEROSCOPY (N/A )  Patient Location: PACU  Anesthesia Type:General  Level of Consciousness: awake, alert  and oriented  Airway & Oxygen Therapy: Patient Spontanous Breathing and Patient connected to face mask oxygen  Post-op Assessment: Report given to RN and Post -op Vital signs reviewed and stable  Post vital signs: Reviewed and stable  Last Vitals:  Vitals:   06/20/17 0837  BP: 139/84  Pulse: 78  Resp: 18  Temp: 36.6 C  SpO2: 100%    Last Pain:  Vitals:   06/20/17 0837  TempSrc: Oral         Complications: No apparent anesthesia complications

## 2017-06-20 NOTE — Anesthesia Postprocedure Evaluation (Signed)
Anesthesia Post Note  Patient: Annette Aguilar  Procedure(s) Performed: DILATATION AND CURETTAGE /HYSTEROSCOPY (N/A )  Patient location during evaluation: PACU Anesthesia Type: General Level of consciousness: awake and alert Pain management: pain level controlled Vital Signs Assessment: post-procedure vital signs reviewed and stable Respiratory status: spontaneous breathing and respiratory function stable Cardiovascular status: stable Anesthetic complications: no     Last Vitals:  Vitals:   06/20/17 0837 06/20/17 1048  BP: 139/84 (!) 142/93  Pulse: 78 86  Resp: 18 11  Temp: 36.6 C 36.4 C  SpO2: 100% 100%    Last Pain:  Vitals:   06/20/17 0837  TempSrc: Annette                 Shaelin Lalley K

## 2017-06-20 NOTE — H&P (Signed)
Preoperative History and Physical  Annette Aguilar is a 52 y.o. with episode of post menopausal bleeding, thickened endometrium, and unable to perform endometrial biopsy in office. No significant preoperative concerns.  Proposed surgery: D&C hysteroscopy  Past Medical History:  Diagnosis Date  . Family history of adverse reaction to anesthesia    FATHER  VERY VERY SLOW TO WAKE UP  . Fibroids    Past Surgical History:  Procedure Laterality Date  . CHOLECYSTECTOMY  2014  . COLONOSCOPY WITH PROPOFOL N/A 04/23/2017   Procedure: COLONOSCOPY WITH PROPOFOL;  Surgeon: Toledo, Benay Pike, MD;  Location: [facility] ENDOSCOPY;  Service: Gastroenterology;  Laterality: N/A;  . ESOPHAGOGASTRODUODENOSCOPY  2014  . GASTRIC BYPASS  2015  . Left Wrist Surgery Left 1990   OB History  No data available  Patient denies any other pertinent gynecologic issues.  Menopause @ 50  No current facility-administered medications on file prior to encounter.    Current Outpatient Medications on File Prior to Encounter  Medication Sig Dispense Refill  . MILK THISTLE EXTRACT PO Take 1 capsule by mouth 2 (two) times daily.    . Multiple Vitamin (MULTIVITAMIN) tablet Take 1 tablet by mouth 2 (two) times daily. Ultra life otc supplement    . OVER THE COUNTER MEDICATION Take 1 tablet by mouth 2 (two) times daily. relora otc dietary supplement    . Probiotic CAPS Take 1 capsule by mouth 2 (two) times daily.     No Known Allergies  Social History:   reports that  has never smoked. she has never used smokeless tobacco. She reports that she does not drink alcohol or use drugs.  Family History  Problem Relation Age of Onset  . Breast cancer Maternal Aunt   . Breast cancer Paternal Aunt   . Breast cancer Maternal Grandmother     Review of Systems: Noncontributory  PHYSICAL EXAM: Blood pressure 139/84, pulse 78, temperature 97.9 F (36.6 C), temperature source Oral, resp. rate 18, height 5\' 3"  (1.6 m), weight 105.7 kg (233  lb), SpO2 100 %. General appearance - alert, well appearing, and in no distress Chest - clear to auscultation, no wheezes, rales or rhonchi, symmetric air entry Heart - normal rate and regular rhythm Abdomen - soft, nontender, nondistended, no masses or organomegaly Pelvic - examination not indicated Extremities - peripheral pulses normal, no pedal edema, no clubbing or cyanosis  Labs: Results for orders placed or performed during the hospital encounter of 06/20/17 (from the past 336 hour(s))  ABO/Rh   Collection Time: 06/20/17  8:38 AM  Result Value Ref Range   ABO/RH(D)      A POS Performed at [facility], [address] [place], [place] [address]   Pregnancy, urine POC   Collection Time: 06/20/17  8:55 AM  Result Value Ref Range   Preg Test, Ur NEGATIVE NEGATIVE  Results for orders placed or performed during the hospital encounter of 06/17/17 (from the past 336 hour(s))  Basic metabolic panel   Collection Time: 06/17/17  7:12 AM  Result Value Ref Range   Sodium 141 135 - 145 mmol/L   Potassium 4.0 3.5 - 5.1 mmol/L   Chloride 106 101 - 111 mmol/L   CO2 29 22 - 32 mmol/L   Glucose, Bld 99 65 - 99 mg/dL   BUN 16 6 - 20 mg/dL   Creatinine, Ser 0.58 0.44 - 1.00 mg/dL   Calcium 8.6 (L) 8.9 - 10.3 mg/dL   GFR calc non Af Amer >60 >60  mL/min   GFR calc Af Amer >60 >60 mL/min   Anion gap 6 5 - 15  CBC with Differential/Platelet   Collection Time: 06/17/17  7:12 AM  Result Value Ref Range   WBC 4.5 3.6 - 11.0 K/uL   RBC 4.29 3.80 - 5.20 MIL/uL   Hemoglobin 12.3 12.0 - 16.0 g/dL   HCT 37.0 35.0 - 47.0 %   MCV 86.2 80.0 - 100.0 fL   MCH 28.7 26.0 - 34.0 pg   MCHC 33.3 32.0 - 36.0 g/dL   RDW 15.5 (H) 11.5 - 14.5 %   Platelets 326 150 - 440 K/uL   Neutrophils Relative % 59 %   Neutro Abs 2.6 1.4 - 6.5 K/uL   Lymphocytes Relative 28 %   Lymphs Abs 1.3 1.0 - 3.6 K/uL   Monocytes Relative 9 %   Monocytes Absolute 0.4 0.2 - 0.9 K/uL   Eosinophils Relative 4 %    Eosinophils Absolute 0.2 0 - 0.7 K/uL   Basophils Relative 0 %   Basophils Absolute 0.0 0 - 0.1 K/uL  Type and screen [facility]   Collection Time: 06/17/17  7:12 AM  Result Value Ref Range   ABO/RH(D) A POS    Antibody Screen NEG    Sample Expiration 07/01/2017    Extend sample reason      NO TRANSFUSIONS OR PREGNANCY IN THE PAST 3 MONTHS Performed at [facility], [address], [place], [place] [address]     Assessment: Patient Active Problem List   Diagnosis Date Noted  . Postmenopausal bleeding 06/20/2017    Plan: Patient will undergo surgical management with D&C hysteroscopy.   The risks of surgery were discussed in detail with the patient including but not limited to: bleeding which may require transfusion or reoperation; infection which may require antibiotics; injury to surrounding organs which may involve bowel, bladder, ureters ; need for additional procedures including laparoscopy or laparotomy; thromboembolic phenomenon, surgical site problems and other postoperative/anesthesia complications. Likelihood of success in alleviating the patient's condition was discussed. Routine postoperative instructions will be reviewed with the patient and her family in detail after surgery.  The patient concurred with the proposed plan, giving informed written consent for the surgery.  Patient has been NPO since last night she will remain NPO for procedure.  Anesthesia and OR aware.  To OR when ready.   ----- Larey Days, MD Attending Obstetrician and Gynecologist [facility], Department of [facility]

## 2017-06-20 NOTE — Anesthesia Post-op Follow-up Note (Signed)
Anesthesia QCDR form completed.        

## 2017-06-20 NOTE — Op Note (Signed)
Operative Report Hysteroscopy, Dilation and Curettage 06/20/2017  Patient:  Annette Aguilar  52 y.o. female Preoperative diagnosis:  postmenopausal bleeding Postoperative diagnosis:  same  PROCEDURE:  Procedure(s): DILATATION AND CURETTAGE /HYSTEROSCOPY (N/A) Surgeon:  Surgeon(s) and Role:    * Ward, Honor Loh, MD - Primary Anesthesia:  MAC I/O: Total I/O In: 1000 [I.V.:1000] Out: -  Specimens:  Endometrial curettings Complications: None Apparent Disposition:  VS stable to PACU  Findings: Uterus, mobile, normal size, sounding to 9 cm; normal cervix, vagina, perineum.   Indication for procedure/Consents: 52 y.o. here for scheduled surgery for the aforementioned diagnoses.   Risks of surgery were discussed with the patient including but not limited to: bleeding which may require transfusion; infection which may require antibiotics; injury to uterus or surrounding organs; intrauterine scarring which may impair future fertility; need for additional procedures including laparotomy or laparoscopy; and other postoperative/anesthesia complications. Written informed consent was obtained.    Procedure Details:   The patient was then taken to the operating room where anesthesia was administered and was found to be adequate.  After a formal and timeout was performed, she was placed in the dorsal lithotomy position and examined with the above findings. She was then prepped and draped in the sterile manner.  A speculum was then placed in the patient's vagina and a single tooth tenaculum was applied to the anterior lip of the cervix.   The uterus was sounded to 9 cm. Her cervix was serially dilated to accommodate the myoscope, with findings as above. A sharp curettage was then performed until there was a gritty texture in all four quadrants. The specimen was handed off to nursing.  The camera was reinserted and confirmed the uterus had been evacuated. The tenaculum was removed from the anterior lip of the  cervix and silver nitrate applied for hemostasis. The vaginal speculum was removed after noting good hemostasis. The patient tolerated the procedure well and was taken to the recovery area awake, extubated and in stable condition.  The patient will be discharged to home as per PACU criteria.  Routine postoperative instructions given. She will follow up in the clinic in two to four weeks for postoperative evaluation.  Larey Days, MD [facility] OBGYN Attending Gynecologist

## 2017-06-20 NOTE — Anesthesia Preprocedure Evaluation (Addendum)
Anesthesia Evaluation  Patient identified by MRN, date of birth, ID band Patient awake    Reviewed: Allergy & Precautions, NPO status , Patient's Chart, lab work & pertinent test results  History of Anesthesia Complications (+) Family history of anesthesia reactionNegative for: history of anesthetic complications (Father with cardiac arrest)  Airway Mallampati: II       Dental   Pulmonary neg sleep apnea, neg COPD,           Cardiovascular (-) hypertension(-) Past MI and (-) CHF (-) dysrhythmias (-) Valvular Problems/Murmurs     Neuro/Psych neg Seizures    GI/Hepatic Neg liver ROS, neg GERD  ,  Endo/Other  neg diabetes  Renal/GU negative Renal ROS     Musculoskeletal   Abdominal   Peds  Hematology   Anesthesia Other Findings   Reproductive/Obstetrics                            Anesthesia Physical Anesthesia Plan  ASA: II  Anesthesia Plan: General   Post-op Pain Management:    Induction:   PONV Risk Score and Plan: 3 and TIVA, Propofol infusion and Midazolam  Airway Management Planned: Simple Face Mask  Additional Equipment:   Intra-op Plan:   Post-operative Plan:   Informed Consent: I have reviewed the patients History and Physical, chart, labs and discussed the procedure including the risks, benefits and alternatives for the proposed anesthesia with the patient or authorized representative who has indicated his/her understanding and acceptance.     Plan Discussed with:   Anesthesia Plan Comments:         Anesthesia Quick Evaluation

## 2017-06-20 NOTE — OR Nursing (Signed)
Patient had ice chips tolerated well

## 2017-06-23 LAB — SURGICAL PATHOLOGY

## 2018-01-08 ENCOUNTER — Other Ambulatory Visit: Payer: Self-pay | Admitting: Orthopedic Surgery

## 2018-01-08 DIAGNOSIS — G8929 Other chronic pain: Secondary | ICD-10-CM

## 2018-01-08 DIAGNOSIS — M5441 Lumbago with sciatica, right side: Principal | ICD-10-CM

## 2018-01-09 ENCOUNTER — Other Ambulatory Visit: Payer: Self-pay | Admitting: Orthopedic Surgery

## 2018-01-09 DIAGNOSIS — M5441 Lumbago with sciatica, right side: Principal | ICD-10-CM

## 2018-01-09 DIAGNOSIS — G8929 Other chronic pain: Secondary | ICD-10-CM

## 2018-01-27 ENCOUNTER — Ambulatory Visit
Admission: RE | Admit: 2018-01-27 | Discharge: 2018-01-27 | Disposition: A | Discharge status: Home / Self Care | Payer: 59 | Source: Ambulatory Visit | Attending: Orthopedic Surgery | Admitting: Orthopedic Surgery

## 2018-01-27 DIAGNOSIS — M5126 Other intervertebral disc displacement, lumbar region: Secondary | ICD-10-CM | POA: Insufficient documentation

## 2018-01-27 DIAGNOSIS — M47816 Spondylosis without myelopathy or radiculopathy, lumbar region: Secondary | ICD-10-CM | POA: Insufficient documentation

## 2018-01-27 DIAGNOSIS — M8938 Hypertrophy of bone, other site: Secondary | ICD-10-CM | POA: Diagnosis not present

## 2018-01-27 DIAGNOSIS — M5441 Lumbago with sciatica, right side: Secondary | ICD-10-CM | POA: Insufficient documentation

## 2018-01-27 DIAGNOSIS — G8929 Other chronic pain: Secondary | ICD-10-CM | POA: Insufficient documentation

## 2018-01-27 DIAGNOSIS — M48061 Spinal stenosis, lumbar region without neurogenic claudication: Secondary | ICD-10-CM | POA: Diagnosis not present

## 2018-03-04 ENCOUNTER — Other Ambulatory Visit: Payer: Self-pay | Admitting: Obstetrics and Gynecology

## 2018-03-04 DIAGNOSIS — Z1231 Encounter for screening mammogram for malignant neoplasm of breast: Secondary | ICD-10-CM

## 2018-03-10 ENCOUNTER — Other Ambulatory Visit: Payer: Self-pay

## 2018-03-10 ENCOUNTER — Ambulatory Visit: Payer: 59 | Attending: Obstetrics and Gynecology | Admitting: Physical Therapy

## 2018-03-10 ENCOUNTER — Encounter: Payer: Self-pay | Admitting: Physical Therapy

## 2018-03-10 DIAGNOSIS — M62838 Other muscle spasm: Secondary | ICD-10-CM

## 2018-03-10 DIAGNOSIS — R278 Other lack of coordination: Secondary | ICD-10-CM | POA: Diagnosis present

## 2018-03-10 DIAGNOSIS — M533 Sacrococcygeal disorders, not elsewhere classified: Secondary | ICD-10-CM

## 2018-03-10 NOTE — Patient Instructions (Signed)
Open book   15 reps  Knee to chest with towel under thighs ( cross thigh over) . Press elbows and shoulder down on exhale to draw thigh closer  10 reps    Clam shells  20 reps    Complimentary stretch : figure -4 seat ____  Sitting with feet on the floor, hip socket ( no more propping feet up)  Bring a profile pic of workstation   Sleeping with pillow between knees

## 2018-03-11 ENCOUNTER — Ambulatory Visit
Admission: RE | Admit: 2018-03-11 | Discharge: 2018-03-11 | Disposition: A | Discharge status: Home / Self Care | Payer: 59 | Source: Ambulatory Visit | Attending: Obstetrics and Gynecology | Admitting: Obstetrics and Gynecology

## 2018-03-11 DIAGNOSIS — Z1231 Encounter for screening mammogram for malignant neoplasm of breast: Secondary | ICD-10-CM | POA: Diagnosis not present

## 2018-03-11 NOTE — Therapy (Signed)
[place] MAIN [facility] SERVICES [address] [place], [place], [address] Phone: [phone]   Fax:  [phone]  Physical Therapy Evaluation  Patient Details  Name: Annette Aguilar MRN: [id number] Date of Birth: [birth year] Referring Provider (PT): Hassan Buckler    Encounter Date: 03/10/2018  PT End of Session - 03/11/18 2154    Visit Number  1    Number of Visits  12    Date for PT Re-Evaluation  06/03/18    PT Start Time  1603    PT Stop Time  1700    PT Time Calculation (min)  57 min    Activity Tolerance  Patient tolerated treatment well    Behavior During Therapy  [facility] for tasks assessed/performed       Past Medical History:  Diagnosis Date  . Family history of adverse reaction to anesthesia    FATHER  VERY VERY SLOW TO WAKE UP  . Fibroids     Past Surgical History:  Procedure Laterality Date  . CHOLECYSTECTOMY  2014  . COLONOSCOPY WITH PROPOFOL N/A 04/23/2017   Procedure: COLONOSCOPY WITH PROPOFOL;  Surgeon: Toledo, Benay Pike, MD;  Location: [facility] ENDOSCOPY;  Service: Gastroenterology;  Laterality: N/A;  . ESOPHAGOGASTRODUODENOSCOPY  2014  . GASTRIC BYPASS  2015  . HYSTEROSCOPY W/D&C N/A 06/20/2017   Procedure: DILATATION AND CURETTAGE /HYSTEROSCOPY;  Surgeon: Ward, Honor Loh, MD;  Location: [facility] ORS;  Service: Gynecology;  Laterality: N/A;  . Left Wrist Surgery Left 1990    There were no vitals filed for this visit.   Subjective Assessment - 03/10/18 1613    Subjective 1)Mixed urinary incontinence:  Urinary incontinence/frequency/ nocturia/ SUI: Pt reports not being able to hold her bladder. Pt feels she is too young for pads and this Sx. Pt wears a panty liner due to dribbling to the toilet. Pt changes 2x a day her panty liners.  Pt drinks 2.5 ( 40 fl oz), 1 cup coffee. Pt notices her leakage occurs often when she parks her car at her house and she can not make it inside before leakage.  Frequency of urination: every 30 min per 2  hours.  Nocturia : 2-3 x.  Pt has been tested by sleep study and she had borderline OSA.  SUI with laughing    2) After falling of the steps 5 months ago onto her tailbone flat back, pt has had LBP with R radiating pain down outside of the leg. Today: 5/10, at its worse 9-10/10. Triggers: sleeping on either side, sit too long 2 hours, lifting, walking too much. Pt received an epidural shot 02/27/18 has decreased the pain 50%.           Pertinent History  Bowel movements occur daily without straining. 1 vaginal delivery with perineal tear , Gastric By-pass. Fibroids, Gallbladder surgery.     Patient Stated Goals  feel normal          [facility] PT Assessment - 03/10/18 1612      Assessment   Medical Diagnosis  Urge incontinence     Referring Provider (PT)  Hassan Buckler       Precautions   Precautions  None      Restrictions   Weight Bearing Restrictions  No      Balance Screen   Has the patient fallen in the past 6 months  Yes   fell off steps      Squat   Comments  narrow BOS, excessive flexion  AROM   Overall AROM   --   limited rotation 20% B      Strength   Overall Strength Comments  hip ext R 3+/5, L 4/5, L hip abd 4-/5, R hip abd       Palpation   Spinal mobility  thoracic spinal hypomobility     SI assessment   R PSIS more posterior, tenderness at B PSIS, L5/S1, L4 B    equal alignment post Tx    Coordination:  Ab overuse with cue for contraction of pelvic floor mm           Objective measurements completed on examination: See above findings.      [place] Adult PT Treatment/Exercise - 03/11/18 2155      Neuro Re-ed    Neuro Re-ed Details   see pt instructions       Moist Heat Therapy   Moist Heat Location  Lumbar Spine;Hip      Manual Therapy   Manual therapy comments  long axis distraction BLE, rotational mob, superior glide of sacrum, MWM                    PT Long Term Goals - 03/10/18 1616      PT LONG TERM GOAL #1   Title  Pt  will be able to decrease her pantyliner from 2 to 0 per day in order improve QOL     Time  8    Period  Weeks    Status  New    Target Date  05/05/18      PT LONG TERM GOAL #2   Title  Pt will report when she parks her car at her house / work and she can make it inside before leakage in order to improve continence    Time  6    Period  Weeks    Status  New    Target Date  04/21/18      PT LONG TERM GOAL #3   Title  Pt will report no radiating pain fron RLE from LBP with sleeping in order to improve sleeping quality     Time  8    Period  Weeks    Status  New    Target Date  05/05/18      PT LONG TERM GOAL #4   Title  Pt will decrease her ODI score from % to < % in order to improve activities.     Time  12    Period  Weeks    Status  New    Target Date  06/02/18      PT LONG TERM GOAL #5   Title  Pt will decrease NIH-CPSI score from % to < % in order to decrease urinary frequency     Time  10    Status  New    Target Date  05/19/18      Additional Long Term Goals   Additional Long Term Goals  Yes      PT LONG TERM GOAL #6   Title  Pt will decrease her nocturia trips from 2-3 x night to < 1x night in order to improve QOL    Time  6    Period  Weeks    Status  Deferred    Target Date  04/21/18      PT LONG TERM GOAL #7   Title  Pt will demo increased hip ext from 3/5 B to > 4/5 in  order to  improve pelvic stability for improve low back pain    Time  10    Period  Weeks    Status  New    Target Date  05/20/18      PT LONG TERM GOAL #8   Title  Pt will demo increase mobility in SIJ and equal alignment of pelvic girdle in order to progress toward deep core coordination    Time  2    Period  Weeks    Status  New    Target Date  03/25/18             Plan - 03/11/18 2155    Clinical Impression Statement  Pt is a 52 yo female who reports urinary incontinence, frequency, nocturia in addition to CLBP with R radiating pain.  These deficits impact her QOL and ADLs.  Pt's clinical presentations include pelvic obliquities, SIJ hypomobility, hip weakness, spinal hypomobility, pelvic dyscoordination movement, and poor posture. Following Tx today, pt showed more equal pelvic girdle alignment and improved sitting posture. Plan to address pelvic floor at upcoming visits.    Clinical Presentation  Evolving    Clinical Decision Making  Moderate    Rehab Potential  Good    PT Frequency  1x / week    PT Duration  12 weeks    PT Treatment/Interventions  Electrical Stimulation;Biofeedback;Moist Heat;Therapeutic exercise;Therapeutic activities;Balance training;Gait training;Stair training;Neuromuscular re-education;Functional mobility training;Manual techniques;Patient/family education;Scar mobilization;Taping;Energy conservation;Traction    Consulted and Agree with Plan of Care  Patient       Patient will benefit from skilled therapeutic intervention in order to improve the following deficits and impairments:  Pain, Improper body mechanics, Impaired sensation, Increased fascial restricitons, Decreased mobility, Decreased scar mobility, Increased muscle spasms, Postural dysfunction, Decreased endurance, Decreased range of motion, Decreased activity tolerance, Decreased coordination, Decreased safety awareness, Impaired flexibility, Hypomobility, Decreased strength  Visit Diagnosis: Other muscle spasm  Other lack of coordination  Sacrococcygeal disorders, not elsewhere classified     Problem List Patient Active Problem List   Diagnosis Date Noted  . Postmenopausal bleeding 06/20/2017    Jerl Mina ,PT, DPT, E-RYT  03/11/2018, 10:23 PM  [place] MAIN [facility] SERVICES [address] [place], [place], [address] Phone: [phone]   Fax:  [phone]  Name: Annette Aguilar MRN: [id number] Date of Birth: Jul 28, 1965

## 2018-03-16 ENCOUNTER — Other Ambulatory Visit: Payer: Self-pay | Admitting: Obstetrics and Gynecology

## 2018-03-16 DIAGNOSIS — N6489 Other specified disorders of breast: Secondary | ICD-10-CM

## 2018-03-16 DIAGNOSIS — R928 Other abnormal and inconclusive findings on diagnostic imaging of breast: Secondary | ICD-10-CM

## 2018-03-17 ENCOUNTER — Ambulatory Visit: Payer: 59 | Admitting: Physical Therapy

## 2018-03-17 DIAGNOSIS — R278 Other lack of coordination: Secondary | ICD-10-CM

## 2018-03-17 DIAGNOSIS — M62838 Other muscle spasm: Secondary | ICD-10-CM | POA: Diagnosis not present

## 2018-03-17 DIAGNOSIS — M533 Sacrococcygeal disorders, not elsewhere classified: Secondary | ICD-10-CM

## 2018-03-17 NOTE — Patient Instructions (Addendum)
Work stretches:  At doorway : mini squat, Hips nd knees don't move , small rotation to massage in the shoulder blade  Maintain contact with head  10 reps each side   Stand perpendicular to the wall 1) Side bend with arms over head  5 breaths    2) One arm push ups with palm at mid arm  10 reps  Shoulders down    _Seat cat cow  ) elbows back, palms drag on thigh, head lengthen  _minisquat with arms behind back  Head lengthens, chin stay put like holding apple under chin _______  AT home   1) Dragging arms ( angel wing up , batwaing down) 10 reps   2) On all fours: 1)  childs poses rocking with pillow under belly   Toes tucked, shoulders downa nd back, paws grip , hands shoulder width apart   2) Segmental mobility of the spine: Table top wrists under shoudlers, knees under hips  Cat: belly button draws up to ceiling, midback hunches, chin tuck Cow: chin away, shoulders blader sink, tailbone up   __  MOVE MOUSE CLOSER TO YOU, elbows need to be by side, shoulder blade down and back

## 2018-03-18 NOTE — Therapy (Signed)
[place] MAIN [facility] SERVICES [address] [place], [place], [address] Phone: [phone]   Fax:  [phone]  Physical Therapy Treatment  Patient Details  Name: Annette Aguilar MRN: [id number] Date of Birth: 07/24/1965 Referring Provider (PT): Hassan Buckler    Encounter Date: 03/17/2018  PT End of Session - 03/18/18 2105    Visit Number  2    Number of Visits  12    Date for PT Re-Evaluation  06/03/18    PT Start Time  1937    PT Stop Time  1503    PT Time Calculation (min)  58 min    Activity Tolerance  Patient tolerated treatment well    Behavior During Therapy  [facility] for tasks assessed/performed       Past Medical History:  Diagnosis Date  . Family history of adverse reaction to anesthesia    FATHER  VERY VERY SLOW TO WAKE UP  . Fibroids     Past Surgical History:  Procedure Laterality Date  . CHOLECYSTECTOMY  2014  . COLONOSCOPY WITH PROPOFOL N/A 04/23/2017   Procedure: COLONOSCOPY WITH PROPOFOL;  Surgeon: Toledo, Benay Pike, MD;  Location: [facility] ENDOSCOPY;  Service: Gastroenterology;  Laterality: N/A;  . ESOPHAGOGASTRODUODENOSCOPY  2014  . GASTRIC BYPASS  2015  . HYSTEROSCOPY W/D&C N/A 06/20/2017   Procedure: DILATATION AND CURETTAGE /HYSTEROSCOPY;  Surgeon: Ward, Honor Loh, MD;  Location: [facility] ORS;  Service: Gynecology;  Laterality: N/A;  . Left Wrist Surgery Left 1990    There were no vitals filed for this visit.  Subjective Assessment - 03/17/18 1613    Subjective  Pt felt sore after last session but it went away. Her low back pain has not changed since last session.     Pertinent History  Bowel movements occur daily without straining. 1 vaginal delivery with perineal tear , Gastric By-pass. Fibroids, Gallbladder surgery.     Patient Stated Goals  feel normal          [facility] PT Assessment - 03/18/18 2105      AROM   Overall AROM   --   ~30% rotation but will tightness still present at thoracic      Palpation   Spinal  mobility  tightness/ hypomobility occiput, C1-T7 upper trap/levator R > L, interspinal mm midtrap     SI assessment   PSIS more aligned                    [facility] Adult PT Treatment/Exercise - 03/18/18 2105      Neuro Re-ed    Neuro Re-ed Details   see pt instructions       Manual Therapy   Manual therapy comments  distraction at occiput. lower cervical/T junction. STM and inferior glide of scap spine, at neck/ shoulder mucles indicated in assessment, Grade III mob at C1-T3 in prone                    PT Long Term Goals - 03/10/18 1616      PT LONG TERM GOAL #1   Title  Pt will be able to decrease her pantyliner from 2 to 0 per day in order improve QOL     Time  8    Period  Weeks    Status  New    Target Date  05/05/18      PT LONG TERM GOAL #2   Title  Pt will report when she parks her car  at her house / work and she can make it inside before leakage in order to improve continence    Time  6    Period  Weeks    Status  New    Target Date  04/21/18      PT LONG TERM GOAL #3   Title  Pt will report no radiating pain fron RLE from LBP with sleeping in order to improve sleeping quality     Time  8    Period  Weeks    Status  New    Target Date  05/05/18      PT LONG TERM GOAL #4   Title  Pt will decrease her ODI score from % to < % in order to improve activities.     Time  12    Period  Weeks    Status  New    Target Date  06/02/18      PT LONG TERM GOAL #5   Title  Pt will decrease NIH-CPSI score from % to < % in order to decrease urinary frequency     Time  10    Status  New    Target Date  05/19/18      Additional Long Term Goals   Additional Long Term Goals  Yes      PT LONG TERM GOAL #6   Title  Pt will decrease her nocturia trips from 2-3 x night to < 1x night in order to improve QOL    Time  6    Period  Weeks    Status  Deferred    Target Date  04/21/18      PT LONG TERM GOAL #7   Title  Pt will demo increased hip ext from 3/5 B  to > 4/5 in order to  improve pelvic stability for improve low back pain    Time  10    Period  Weeks    Status  New    Target Date  05/20/18      PT LONG TERM GOAL #8   Title  Pt will demo increase mobility in SIJ and equal alignment of pelvic girdle in order to progress toward deep core coordination    Time  2    Period  Weeks    Status  New    Target Date  03/25/18            Plan - 03/18/18 2106    Clinical Impression Statement  Pt demo'd decreased back mm tightness which indicates good carry over from last session. Pt showed decreased mm tightness at C/T junction with manual Tx and demo'd improved cervical retraction/scapular retraction after neuro-muscular re-education. Addressing upper spine will help yield greater outcomes with pelvic issues due to increased upper neck / shoulder mm tensions/ forward head posture. Pt continues to benefit from skilled PT.       Rehab Potential  Good    PT Frequency  1x / week    PT Duration  12 weeks    PT Treatment/Interventions  Electrical Stimulation;Biofeedback;Moist Heat;Therapeutic exercise;Therapeutic activities;Balance training;Gait training;Stair training;Neuromuscular re-education;Functional mobility training;Manual techniques;Patient/family education;Scar mobilization;Taping;Energy conservation;Traction    Consulted and Agree with Plan of Care  Patient       Patient will benefit from skilled therapeutic intervention in order to improve the following deficits and impairments:  Pain, Improper body mechanics, Impaired sensation, Increased fascial restricitons, Decreased mobility, Decreased scar mobility, Increased muscle spasms, Postural dysfunction, Decreased endurance, Decreased range of motion,  Decreased activity tolerance, Decreased coordination, Decreased safety awareness, Impaired flexibility, Hypomobility, Decreased strength  Visit Diagnosis: Other muscle spasm  Other lack of coordination  Sacrococcygeal disorders, not  elsewhere classified     Problem List Patient Active Problem List   Diagnosis Date Noted  . Postmenopausal bleeding 06/20/2017    Jerl Mina ,PT, DPT, E-RYT  03/18/2018, 9:18 PM  [place] MAIN [facility] SERVICES [address] [place], [place], [address] Phone: [phone]   Fax:  [phone]  Name: Annette Aguilar MRN: [id number] Date of Birth: 1965-04-28

## 2018-03-25 ENCOUNTER — Encounter: Payer: 59 | Admitting: Physical Therapy

## 2018-03-30 ENCOUNTER — Ambulatory Visit: Payer: 59

## 2018-03-30 ENCOUNTER — Other Ambulatory Visit: Payer: 59

## 2018-03-31 ENCOUNTER — Encounter: Payer: 59 | Admitting: Physical Therapy

## 2018-04-03 ENCOUNTER — Ambulatory Visit
Admission: RE | Admit: 2018-04-03 | Discharge: 2018-04-03 | Disposition: A | Discharge status: Home / Self Care | Payer: Managed Care, Other (non HMO) | Source: Ambulatory Visit | Attending: Obstetrics and Gynecology | Admitting: Obstetrics and Gynecology

## 2018-04-03 DIAGNOSIS — R928 Other abnormal and inconclusive findings on diagnostic imaging of breast: Secondary | ICD-10-CM | POA: Insufficient documentation

## 2018-04-03 DIAGNOSIS — N6489 Other specified disorders of breast: Secondary | ICD-10-CM

## 2018-04-08 ENCOUNTER — Ambulatory Visit: Payer: Managed Care, Other (non HMO) | Admitting: Physical Therapy

## 2018-04-13 ENCOUNTER — Encounter: Payer: 59 | Admitting: Physical Therapy

## 2018-04-20 ENCOUNTER — Encounter: Payer: 59 | Admitting: Physical Therapy

## 2018-12-24 ENCOUNTER — Ambulatory Visit (INDEPENDENT_AMBULATORY_CARE_PROVIDER_SITE_OTHER): Payer: 59 | Admitting: Vascular Surgery

## 2018-12-24 ENCOUNTER — Encounter (INDEPENDENT_AMBULATORY_CARE_PROVIDER_SITE_OTHER): Payer: Self-pay | Admitting: Vascular Surgery

## 2018-12-24 ENCOUNTER — Other Ambulatory Visit: Payer: Self-pay

## 2018-12-24 VITALS — BP 164/96 | HR 91 | Resp 12 | Ht 63.0 in | Wt 264.0 lb

## 2018-12-24 DIAGNOSIS — N939 Abnormal uterine and vaginal bleeding, unspecified: Secondary | ICD-10-CM | POA: Insufficient documentation

## 2018-12-24 DIAGNOSIS — I872 Venous insufficiency (chronic) (peripheral): Secondary | ICD-10-CM | POA: Diagnosis not present

## 2018-12-24 DIAGNOSIS — I890 Lymphedema, not elsewhere classified: Secondary | ICD-10-CM

## 2018-12-24 DIAGNOSIS — K219 Gastro-esophageal reflux disease without esophagitis: Secondary | ICD-10-CM

## 2018-12-24 DIAGNOSIS — Z8742 Personal history of other diseases of the female genital tract: Secondary | ICD-10-CM | POA: Insufficient documentation

## 2018-12-24 DIAGNOSIS — G4733 Obstructive sleep apnea (adult) (pediatric): Secondary | ICD-10-CM | POA: Insufficient documentation

## 2018-12-24 DIAGNOSIS — K8020 Calculus of gallbladder without cholecystitis without obstruction: Secondary | ICD-10-CM | POA: Insufficient documentation

## 2018-12-24 DIAGNOSIS — R768 Other specified abnormal immunological findings in serum: Secondary | ICD-10-CM | POA: Insufficient documentation

## 2018-12-24 DIAGNOSIS — Z8679 Personal history of other diseases of the circulatory system: Secondary | ICD-10-CM | POA: Diagnosis not present

## 2018-12-24 DIAGNOSIS — B351 Tinea unguium: Secondary | ICD-10-CM | POA: Insufficient documentation

## 2018-12-24 NOTE — Progress Notes (Signed)
MRN : [id number]  Annette Aguilar is a 53 y.o. (15-Jun-1965) female who presents with chief complaint of  Chief Complaint  Patient presents with  . New Patient (Initial Visit)  .  History of Present Illness:   Patient is seen for evaluation of leg swelling. The patient first noticed the swelling remotely but is now concerned because of a significant increase in the overall edema. The swelling is associated with pain and discoloration. The patient notes that in the morning the legs are significantly improved but they steadily worsened throughout the course of the day. Elevation makes the legs better, dependency makes them much worse.   There is no history of ulcerations associated with the swelling.   The patient denies any recent changes in their medications.  The patient has not been wearing graduated compression.  The patient has no had any past angiography, interventions or vascular surgery.  The patient denies a history of DVT or PE. There is no prior history of phlebitis. There is no history of primary lymphedema.  There is no history of radiation treatment to the groin or pelvis No history of malignancies. No history of trauma or groin or pelvic surgery. No history of foreign travel or parasitic infections area    Current Meds  Medication Sig  . ferrous sulfate 325 (65 FE) MG EC tablet Take 325 mg by mouth 3 (three) times daily with meals.    Past Medical History:  Diagnosis Date  . Family history of adverse reaction to anesthesia    FATHER  VERY VERY SLOW TO WAKE UP  . Fibroids     Past Surgical History:  Procedure Laterality Date  . CHOLECYSTECTOMY  2014  . COLONOSCOPY WITH PROPOFOL N/A 04/23/2017   Procedure: COLONOSCOPY WITH PROPOFOL;  Surgeon: Toledo, Benay Pike, MD;  Location: [facility] ENDOSCOPY;  Service: Gastroenterology;  Laterality: N/A;  . ESOPHAGOGASTRODUODENOSCOPY  2014  . GASTRIC BYPASS  2015  . HYSTEROSCOPY W/D&C N/A 06/20/2017   Procedure: DILATATION  AND CURETTAGE /HYSTEROSCOPY;  Surgeon: Ward, Honor Loh, MD;  Location: [facility] ORS;  Service: Gynecology;  Laterality: N/A;  . Left Wrist Surgery Left 1990    Social History Social History   Tobacco Use  . Smoking status: Never Smoker  . Smokeless tobacco: Never Used  Substance Use Topics  . Alcohol use: No    Frequency: Never  . Drug use: No    Family History Family History  Problem Relation Age of Onset  . Breast cancer Maternal Aunt   . Breast cancer Paternal Aunt   . Breast cancer Maternal Grandmother   No family history of bleeding/clotting disorders, porphyria or autoimmune disease   No Known Allergies   REVIEW OF SYSTEMS (Negative unless checked)  Constitutional: [] Weight loss  [] Fever  [] Chills Cardiac: [] Chest pain   [] Chest pressure   [] Palpitations   [] Shortness of breath when laying flat   [x] Shortness of breath with exertion. Vascular:  [] Pain in legs with walking   [x] Pain in legs at rest  [] History of DVT   [] Phlebitis   [x] Swelling in legs   [] Varicose veins   [] Non-healing ulcers Pulmonary:   [] Uses home oxygen   [] Productive cough   [] Hemoptysis   [] Wheeze  [] COPD   [] Asthma Neurologic:  [] Dizziness   [] Seizures   [] History of stroke   [] History of TIA  [] Aphasia   [] Vissual changes   [] Weakness or numbness in arm   [] Weakness or numbness in leg Musculoskeletal:   [] Joint swelling   []   Joint pain   [] Low back pain Hematologic:  [] Easy bruising  [] Easy bleeding   [] Hypercoagulable state   [] Anemic Gastrointestinal:  [] Diarrhea   [] Vomiting  [] Gastroesophageal reflux/heartburn   [] Difficulty swallowing. Genitourinary:  [] Chronic kidney disease   [] Difficult urination  [] Frequent urination   [] Blood in urine Skin:  [] Rashes   [] Ulcers  Psychological:  [] History of anxiety   []  History of major depression.  Physical Examination  Vitals:   12/24/18 1600  BP: (!) 164/96  Pulse: 91  Resp: 12  Weight: 264 lb (119.7 kg)  Height: 5\' 3"  (1.6 m)   Body mass index  is 46.77 kg/m. Gen: WD/WN, NAD Head: [place]/AT, No temporalis wasting.  Ear/Nose/Throat: Hearing grossly intact, nares w/o erythema or drainage, poor dentition Eyes: PER, EOMI, sclera nonicteric.  Neck: Supple, no masses.  No bruit or JVD.  Pulmonary:  Good air movement, clear to auscultation bilaterally, no use of accessory muscles.  Cardiac: RRR, normal S1, S2, no Murmurs. Vascular: scattered varicosities present bilaterally.  Mild venous stasis changes to the legs bilaterally.  3-4+ soft pitting edema Vessel Right Left  Radial Palpable Palpable  PT Palpable Palpable  DP Palpable Palpable  Gastrointestinal: soft, non-distended. No guarding/no peritoneal signs.  Musculoskeletal: M/S 5/5 throughout.  No deformity or atrophy.  Neurologic: CN 2-12 intact. Pain and light touch intact in extremities.  Symmetrical.  Speech is fluent. Motor exam as listed above. Psychiatric: Judgment intact, Mood & affect appropriate for pt's clinical situation. Dermatologic: No rashes or ulcers noted.  No changes consistent with cellulitis. Lymph : No Cervical lymphadenopathy, no lichenification or skin changes of chronic lymphedema.  CBC Lab Results  Component Value Date   WBC 4.5 06/17/2017   HGB 12.3 06/17/2017   HCT 37.0 06/17/2017   MCV 86.2 06/17/2017   PLT 326 06/17/2017    BMET    Component Value Date/Time   NA 141 06/17/2017 0712   NA 140 01/20/2013 0511   K 4.0 06/17/2017 0712   K 4.2 01/20/2013 0511   CL 106 06/17/2017 0712   CL 111 (H) 01/20/2013 0511   CO2 29 06/17/2017 0712   CO2 21 01/20/2013 0511   GLUCOSE 99 06/17/2017 0712   GLUCOSE 95 01/20/2013 0511   BUN 16 06/17/2017 0712   BUN 9 01/20/2013 0511   CREATININE 0.58 06/17/2017 0712   CREATININE 0.71 01/20/2013 0511   CALCIUM 8.6 (L) 06/17/2017 0712   CALCIUM 7.7 (L) 01/20/2013 0511   GFRNONAA >60 06/17/2017 0712   GFRNONAA >60 01/20/2013 0511   GFRAA >60 06/17/2017 0712   GFRAA >60 01/20/2013 0511   CrCl cannot be  calculated (Patient's most recent lab result is older than the maximum 21 days allowed.).  COAG Lab Results  Component Value Date   INR 1.0 07/21/2012    Radiology No results found.   Assessment/Plan 1. Lymphedema I have had a long discussion with the patient regarding swelling and why it  causes symptoms.  Patient will begin wearing graduated compression stockings class 1 (20-30 mmHg) on a daily basis a prescription was given. The patient will  beginning wearing the stockings first thing in the morning and removing them in the evening. The patient is instructed specifically not to sleep in the stockings.   In addition, behavioral modification will be initiated.  This will include frequent elevation, use of over the counter pain medications and exercise such as walking.  I have reviewed systemic causes for chronic edema such as liver, kidney and cardiac etiologies.  The patient denies problems with these organ systems.    Consideration for a lymph pump will also be made based upon the effectiveness of conservative therapy.  This would help to improve the edema control and prevent sequela such as ulcers and infections   Patient should undergo duplex ultrasound of the venous system to ensure that DVT or reflux is not present.  The patient will follow-up with me after the ultrasound.   - VAS [place] LOWER EXTREMITY VENOUS REFLUX; Future  2. Chronic venous insufficiency I have had a long discussion with the patient regarding swelling and why it  causes symptoms.  Patient will begin wearing graduated compression stockings class 1 (20-30 mmHg) on a daily basis a prescription was given. The patient will  beginning wearing the stockings first thing in the morning and removing them in the evening. The patient is instructed specifically not to sleep in the stockings.   In addition, behavioral modification will be initiated.  This will include frequent elevation, use of over the counter pain  medications and exercise such as walking.  I have reviewed systemic causes for chronic edema such as liver, kidney and cardiac etiologies.  The patient denies problems with these organ systems.    Consideration for a lymph pump will also be made based upon the effectiveness of conservative therapy.  This would help to improve the edema control and prevent sequela such as ulcers and infections   Patient should undergo duplex ultrasound of the venous system to ensure that DVT or reflux is not present.  The patient will follow-up with me after the ultrasound.   - VAS [place] LOWER EXTREMITY VENOUS REFLUX; Future  3. Gastroesophageal reflux disease without esophagitis Continue PPI as already ordered, this medication has been reviewed and there are no changes at this time.  Avoidence of caffeine and alcohol  Moderate elevation of the head of the bed   4. History of hypertension Continue antihypertensive medications as already ordered, these medications have been reviewed and there are no changes at this time.     Hortencia Pilar, MD  12/28/2018 11:32 AM

## 2018-12-28 DIAGNOSIS — I872 Venous insufficiency (chronic) (peripheral): Secondary | ICD-10-CM | POA: Insufficient documentation

## 2018-12-28 DIAGNOSIS — I890 Lymphedema, not elsewhere classified: Secondary | ICD-10-CM | POA: Insufficient documentation

## 2019-03-23 DIAGNOSIS — Z6841 Body Mass Index (BMI) 40.0 and over, adult: Secondary | ICD-10-CM | POA: Insufficient documentation

## 2019-03-23 DIAGNOSIS — E538 Deficiency of other specified B group vitamins: Secondary | ICD-10-CM | POA: Insufficient documentation

## 2019-03-23 DIAGNOSIS — E559 Vitamin D deficiency, unspecified: Secondary | ICD-10-CM | POA: Insufficient documentation

## 2019-03-30 NOTE — Progress Notes (Signed)
MRN : [id number]  Annette Aguilar is a 53 y.o. (04/01/1966) female who presents with chief complaint of No chief complaint on file. Marland Kitchen  History of Present Illness:   The patient returns for followup evaluation 3 months after the initial visit. The patient continues to have pain in the lower extremities with dependency. The pain is lessened with elevation. Graduated compression stockings, Class I (20-30 mmHg), have been worn but the stockings do not eliminate the leg pain. Over-the-counter analgesics do not improve the symptoms. The degree of discomfort continues to interfere with daily activities. The patient notes the pain in the legs is causing problems with daily exercise, at the workplace and even with household activities and maintenance such as standing in the kitchen preparing meals and doing dishes.   Venous ultrasound shows normal deep venous system, no evidence of acute or chronic DVT.  Superficial reflux is present in the great saphenous veins bilaterally  No outpatient medications have been marked as taking for the 04/01/19 encounter (Appointment) with Delana Meyer, Dolores Lory, MD.    Past Medical History:  Diagnosis Date  . Family history of adverse reaction to anesthesia    FATHER  VERY VERY SLOW TO WAKE UP  . Fibroids     Past Surgical History:  Procedure Laterality Date  . CHOLECYSTECTOMY  2014  . COLONOSCOPY WITH PROPOFOL N/A 04/23/2017   Procedure: COLONOSCOPY WITH PROPOFOL;  Surgeon: Toledo, Benay Pike, MD;  Location: [facility] ENDOSCOPY;  Service: Gastroenterology;  Laterality: N/A;  . ESOPHAGOGASTRODUODENOSCOPY  2014  . GASTRIC BYPASS  2015  . HYSTEROSCOPY W/D&C N/A 06/20/2017   Procedure: DILATATION AND CURETTAGE /HYSTEROSCOPY;  Surgeon: Ward, Honor Loh, MD;  Location: [facility] ORS;  Service: Gynecology;  Laterality: N/A;  . Left Wrist Surgery Left 1990    Social History Social History   Tobacco Use  . Smoking status: Never Smoker  . Smokeless tobacco: Never Used  Substance  Use Topics  . Alcohol use: No    Frequency: Never  . Drug use: No    Family History Family History  Problem Relation Age of Onset  . Breast cancer Maternal Aunt   . Breast cancer Paternal Aunt   . Breast cancer Maternal Grandmother     No Known Allergies   REVIEW OF SYSTEMS (Negative unless checked)  Constitutional: [] Weight loss  [] Fever  [] Chills Cardiac: [] Chest pain   [] Chest pressure   [] Palpitations   [] Shortness of breath when laying flat   [] Shortness of breath with exertion. Vascular:  [] Pain in legs with walking   [x] Pain in legs at rest  [] History of DVT   [] Phlebitis   [x] Swelling in legs   [x] Varicose veins   [] Non-healing ulcers Pulmonary:   [] Uses home oxygen   [] Productive cough   [] Hemoptysis   [] Wheeze  [] COPD   [] Asthma Neurologic:  [] Dizziness   [] Seizures   [] History of stroke   [] History of TIA  [] Aphasia   [] Vissual changes   [] Weakness or numbness in arm   [] Weakness or numbness in leg Musculoskeletal:   [] Joint swelling   [] Joint pain   [] Low back pain Hematologic:  [] Easy bruising  [] Easy bleeding   [] Hypercoagulable state   [] Anemic Gastrointestinal:  [] Diarrhea   [] Vomiting  [x] Gastroesophageal reflux/heartburn   [] Difficulty swallowing. Genitourinary:  [] Chronic kidney disease   [] Difficult urination  [] Frequent urination   [] Blood in urine Skin:  [] Rashes   [] Ulcers  Psychological:  [] History of anxiety   []  History of major depression.  Physical Examination  There were no vitals filed for this visit. There is no height or weight on file to calculate BMI. Gen: WD/WN, NAD Head: [place]/AT, No temporalis wasting.  Ear/Nose/Throat: Hearing grossly intact, nares w/o erythema or drainage Eyes: PER, EOMI, sclera nonicteric.  Neck: Supple, no large masses.   Pulmonary:  Good air movement, no audible wheezing bilaterally, no use of accessory muscles.  Cardiac: RRR, no JVD Vascular: Large varicosities present extensively greater than 10 mm bilaterally.  Mild  venous stasis changes to the legs bilaterally.  2+ soft pitting edema Vessel Right Left  PT Palpable Palpable  DP Palpable Palpable  Gastrointestinal: Non-distended. No guarding/no peritoneal signs.  Musculoskeletal: M/S 5/5 throughout.  No deformity or atrophy.  Neurologic: CN 2-12 intact. Symmetrical.  Speech is fluent. Motor exam as listed above. Psychiatric: Judgment intact, Mood & affect appropriate for pt's clinical situation. Dermatologic: venous rashes no ulcers noted.  No changes consistent with cellulitis. Lymph : No lichenification or skin changes of chronic lymphedema.  CBC Lab Results  Component Value Date   WBC 4.5 06/17/2017   HGB 12.3 06/17/2017   HCT 37.0 06/17/2017   MCV 86.2 06/17/2017   PLT 326 06/17/2017    BMET    Component Value Date/Time   NA 141 06/17/2017 0712   NA 140 01/20/2013 0511   K 4.0 06/17/2017 0712   K 4.2 01/20/2013 0511   CL 106 06/17/2017 0712   CL 111 (H) 01/20/2013 0511   CO2 29 06/17/2017 0712   CO2 21 01/20/2013 0511   GLUCOSE 99 06/17/2017 0712   GLUCOSE 95 01/20/2013 0511   BUN 16 06/17/2017 0712   BUN 9 01/20/2013 0511   CREATININE 0.58 06/17/2017 0712   CREATININE 0.71 01/20/2013 0511   CALCIUM 8.6 (L) 06/17/2017 0712   CALCIUM 7.7 (L) 01/20/2013 0511   GFRNONAA >60 06/17/2017 0712   GFRNONAA >60 01/20/2013 0511   GFRAA >60 06/17/2017 0712   GFRAA >60 01/20/2013 0511   CrCl cannot be calculated (Patient's most recent lab result is older than the maximum 21 days allowed.).  COAG Lab Results  Component Value Date   INR 1.0 07/21/2012    Radiology No results found.  Assessment/Plan 1. Chronic venous insufficiency Recommend  I have reviewed my previous  discussion with the patient regarding  varicose veins and why they cause symptoms. Patient will continue  wearing graduated compression stockings class 1 on a daily basis, beginning first thing in the morning and removing them in the evening.    In addition,  behavioral modification including elevation during the day was again discussed and this will continue.  The patient has utilized over the counter pain medications and has been exercising.  However, at this time conservative therapy has not alleviated the patient's symptoms of leg pain and swelling  Recommend: laser ablation of the left then the right great saphenous veins to eliminate the symptoms of pain and swelling of the lower extremities caused by the severe superficial venous reflux disease.   2. Lymphedema See #1  3. Gastroesophageal reflux disease without esophagitis Continue PPI as already ordered, this medication has been reviewed and there are no changes at this time.  Avoidence of caffeine and alcohol  Moderate elevation of the head of the bed    Hortencia Pilar, MD  03/30/2019 6:45 PM

## 2019-04-01 ENCOUNTER — Ambulatory Visit (INDEPENDENT_AMBULATORY_CARE_PROVIDER_SITE_OTHER): Payer: Managed Care, Other (non HMO) | Admitting: Vascular Surgery

## 2019-04-01 ENCOUNTER — Other Ambulatory Visit: Payer: Self-pay

## 2019-04-01 ENCOUNTER — Ambulatory Visit (INDEPENDENT_AMBULATORY_CARE_PROVIDER_SITE_OTHER): Payer: Managed Care, Other (non HMO)

## 2019-04-01 VITALS — BP 156/84 | HR 103 | Resp 16 | Wt 250.0 lb

## 2019-04-01 DIAGNOSIS — I872 Venous insufficiency (chronic) (peripheral): Secondary | ICD-10-CM

## 2019-04-01 DIAGNOSIS — I890 Lymphedema, not elsewhere classified: Secondary | ICD-10-CM

## 2019-04-01 DIAGNOSIS — K219 Gastro-esophageal reflux disease without esophagitis: Secondary | ICD-10-CM | POA: Diagnosis not present

## 2019-04-21 ENCOUNTER — Encounter (INDEPENDENT_AMBULATORY_CARE_PROVIDER_SITE_OTHER): Payer: Self-pay | Admitting: Vascular Surgery

## 2019-04-23 IMAGING — MG DIGITAL DIAGNOSTIC UNILATERAL LEFT MAMMOGRAM WITH TOMO AND CAD
6 series · 6 of 18 positions shown · non-contrast
Comparison: Previous exam(s).

CLINICAL DATA: 52-year-old female for further evaluation of
possible LEFT breast asymmetry on screening mammogram

EXAM:
DIGITAL DIAGNOSTIC UNILATERAL LEFT MAMMOGRAM WITH CAD AND TOMO

[L CC synth-2D (1 of 2)]
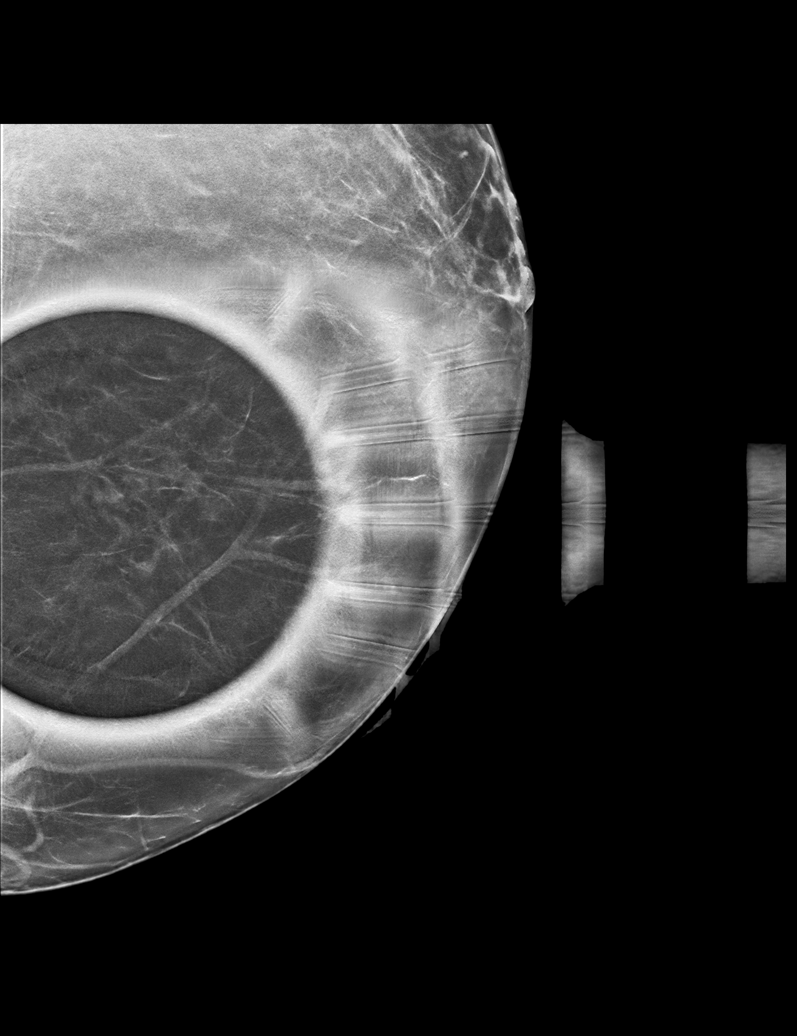

[L CC synth-2D (2 of 2)]
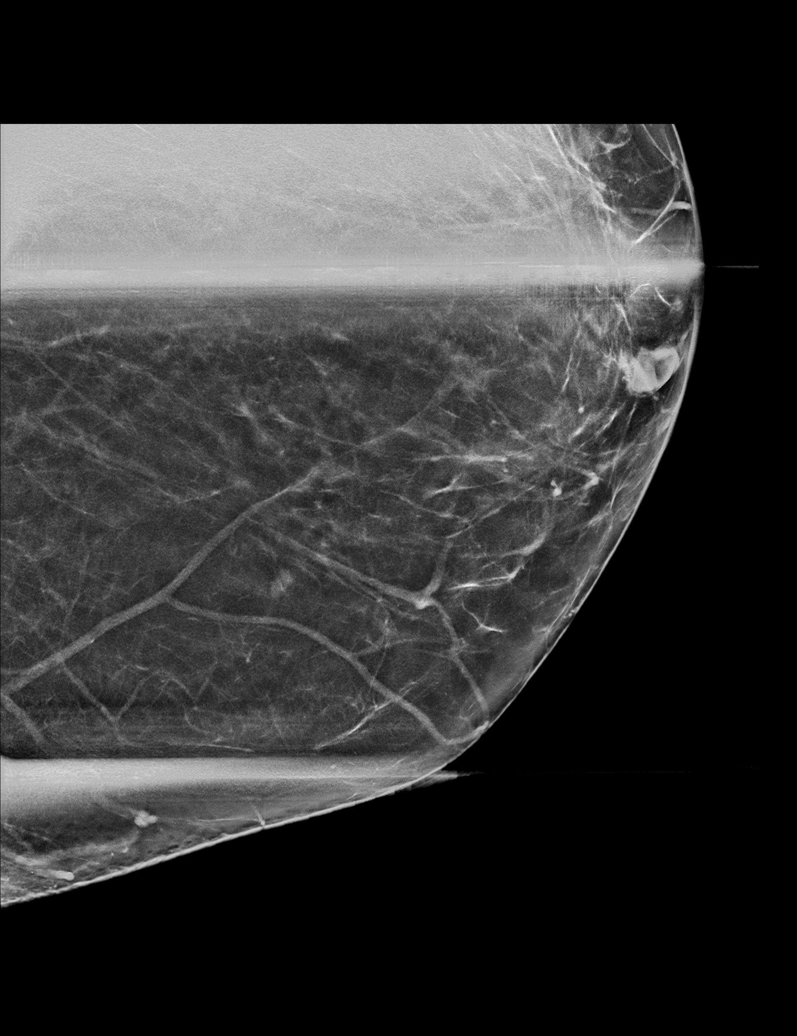

[L ML synth-2D]
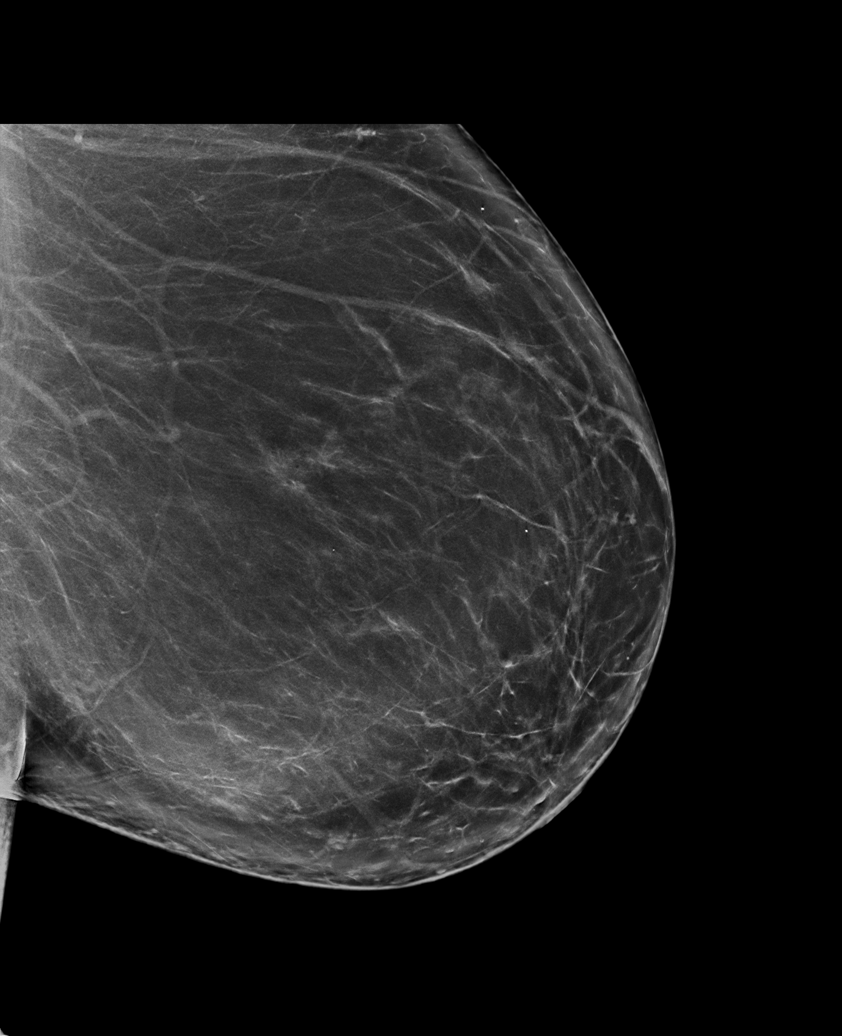

[L CC tomo (1 of 2) · tomo slice 31/61.0]
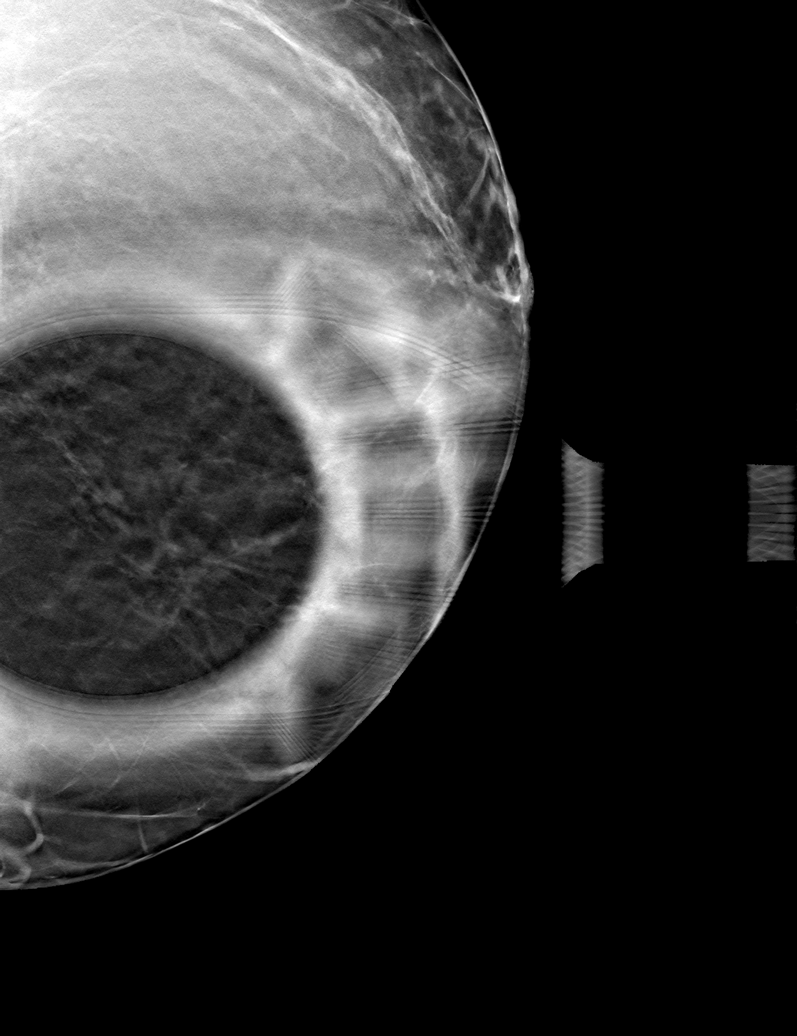

[L CC tomo (2 of 2) · tomo slice 33/66.0]
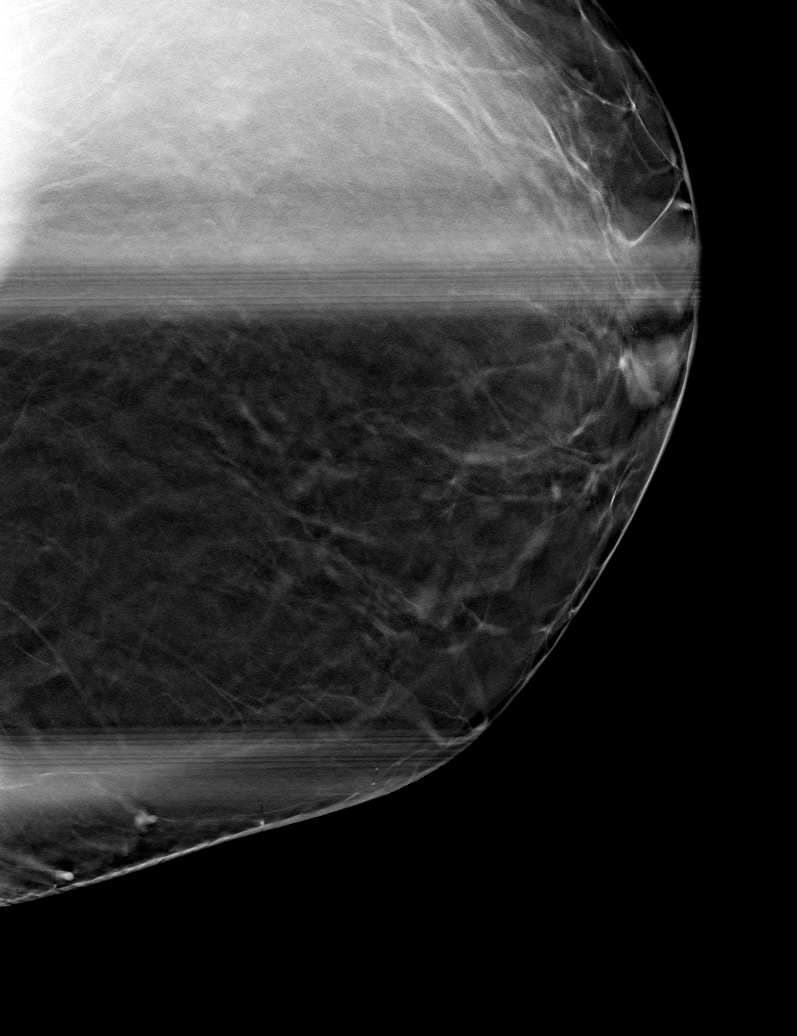

[L ML tomo · tomo slice 51/100.0]
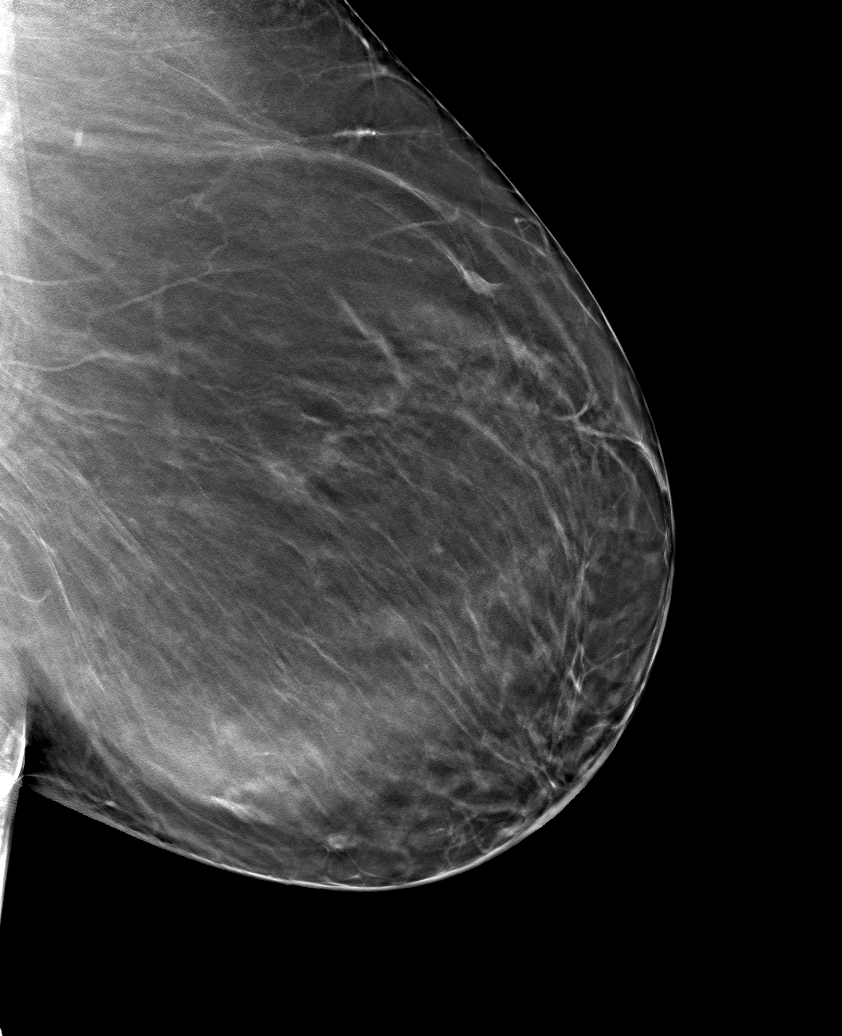

[6 of 18 positions shown; findings below may reference images not displayed]

ACR Breast Density Category b: There are scattered areas of
fibroglandular density.
FINDINGS: 2D/3D full field and spot compression views of the LEFT breast
demonstrate effacement of the possible LEFT breast asymmetry
identified on the screening study.

No persistent mass, distortion or worrisome calcifications are
noted.

Mammographic images were processed with CAD.
IMPRESSION: No persistent abnormality in the area of the screening study
finding.

RECOMMENDATION:
Bilateral screening mammogram in 1 year

I have discussed the findings and recommendations with the patient.
Results were also provided in writing at the conclusion of the
visit. If applicable, a reminder letter will be sent to the patient
regarding the next appointment.

BI-RADS CATEGORY  1: Negative.

## 2019-05-20 ENCOUNTER — Ambulatory Visit (INDEPENDENT_AMBULATORY_CARE_PROVIDER_SITE_OTHER): Payer: Managed Care, Other (non HMO) | Admitting: Vascular Surgery

## 2019-05-20 ENCOUNTER — Encounter (INDEPENDENT_AMBULATORY_CARE_PROVIDER_SITE_OTHER): Payer: Self-pay | Admitting: Vascular Surgery

## 2019-05-20 ENCOUNTER — Other Ambulatory Visit: Payer: Self-pay

## 2019-05-20 VITALS — BP 144/83 | HR 98 | Resp 18 | Ht 63.0 in | Wt 242.0 lb

## 2019-05-20 DIAGNOSIS — I83811 Varicose veins of right lower extremities with pain: Secondary | ICD-10-CM

## 2019-05-20 DIAGNOSIS — I872 Venous insufficiency (chronic) (peripheral): Secondary | ICD-10-CM

## 2019-05-20 DIAGNOSIS — I83812 Varicose veins of left lower extremities with pain: Secondary | ICD-10-CM

## 2019-05-20 NOTE — Progress Notes (Signed)
    MRN : [id number]  Annette Aguilar is a 54 y.o. (14-Dec-1965) female who presents with chief complaint of leg pain in association with venous insufficiency.    The patient's left lower extremity was sterilely prepped and draped.  The ultrasound machine was used to visualize the left great saphenous vein throughout its course.  A segment below the knee was selected for access.  The saphenous vein was accessed without difficulty using ultrasound guidance with a micropuncture needle.   An 0.018  wire was placed beyond the saphenofemoral junction through the sheath and the microneedle was removed.  The 65 cm sheath was then placed over the wire and the wire and dilator were removed.  The laser fiber was placed through the sheath and its tip was placed approximately 2 cm below the saphenofemoral junction.  Tumescent anesthesia was then created with a dilute lidocaine solution.  Laser energy was then delivered with constant withdrawal of the sheath and laser fiber.  Approximately 1650 joules of energy were delivered over a length of 39 cm.  Sterile dressings were placed.  The patient tolerated the procedure well without complications.

## 2019-05-21 ENCOUNTER — Other Ambulatory Visit (INDEPENDENT_AMBULATORY_CARE_PROVIDER_SITE_OTHER): Payer: Self-pay | Admitting: Vascular Surgery

## 2019-05-21 DIAGNOSIS — Z9889 Other specified postprocedural states: Secondary | ICD-10-CM

## 2019-05-24 ENCOUNTER — Other Ambulatory Visit: Payer: Self-pay

## 2019-05-24 ENCOUNTER — Ambulatory Visit (INDEPENDENT_AMBULATORY_CARE_PROVIDER_SITE_OTHER): Payer: Managed Care, Other (non HMO)

## 2019-05-24 DIAGNOSIS — Z9889 Other specified postprocedural states: Secondary | ICD-10-CM

## 2019-05-31 ENCOUNTER — Telehealth (INDEPENDENT_AMBULATORY_CARE_PROVIDER_SITE_OTHER): Payer: Self-pay

## 2019-05-31 NOTE — Telephone Encounter (Signed)
Patient left a voicemail stating that had a laser ablation 05/20/19 and on 05/28/19 she started having left inner leg (knee area) swelling and pain. The patient was taking ibuprofen 800mg  every 3 hours but on 1/31 and 2/1 she started throwing up. The patient stated that it is hard to walk and has been wearing compression stockings. I spoke with Eulogio Ditch NP and she advise that the patient shouldn't be taking NSAIDS that close together and should be taking with food. The patient was advise to alternate tylenol 500-1000mg  and ibuprofen 600-800mg  every 4-6hours but if she take 1000mg  tylenol she only take no more than 4 times. Patient verbalized understanding with medical advice.

## 2019-06-03 NOTE — Telephone Encounter (Signed)
Patient left a voicemail stating that she is having tenderness in the inner knee area from the laser ablation. I spoke with Eulogio Ditch NP and she advise this is to be expected. The patient requested for Xanax to be called into pharmacy for next laser.I made the patient aware that Rx will be call into pharmacy and medical advice.

## 2019-06-10 ENCOUNTER — Other Ambulatory Visit (INDEPENDENT_AMBULATORY_CARE_PROVIDER_SITE_OTHER): Payer: Managed Care, Other (non HMO) | Admitting: Vascular Surgery

## 2019-06-15 ENCOUNTER — Encounter (INDEPENDENT_AMBULATORY_CARE_PROVIDER_SITE_OTHER): Payer: Managed Care, Other (non HMO)

## 2019-07-01 ENCOUNTER — Other Ambulatory Visit: Payer: Self-pay

## 2019-07-01 ENCOUNTER — Ambulatory Visit (INDEPENDENT_AMBULATORY_CARE_PROVIDER_SITE_OTHER): Payer: Managed Care, Other (non HMO) | Admitting: Vascular Surgery

## 2019-07-01 ENCOUNTER — Encounter (INDEPENDENT_AMBULATORY_CARE_PROVIDER_SITE_OTHER): Payer: Self-pay | Admitting: Vascular Surgery

## 2019-07-01 VITALS — BP 141/82 | HR 92 | Resp 16 | Wt 237.0 lb

## 2019-07-01 DIAGNOSIS — I872 Venous insufficiency (chronic) (peripheral): Secondary | ICD-10-CM | POA: Diagnosis not present

## 2019-07-01 NOTE — Progress Notes (Signed)
    MRN : [id number]  Annette Aguilar is a 54 y.o. (1966-04-09) female who presents with chief complaint of No chief complaint on file. .    The patient's right lower extremity was sterilely prepped and draped.  The ultrasound machine was used to visualize the right great saphenous saphenous vein throughout its course.  A segment below the knee was selected for access.  The saphenous vein was accessed without difficulty using ultrasound guidance with a micropuncture needle.   An 0.018  wire was placed beyond the saphenofemoral junction through the sheath and the microneedle was removed.  The 65 cm sheath was then placed over the wire and the wire and dilator were removed.  The laser fiber was placed through the sheath and its tip was placed approximately 2 cm below the saphenofemoral junction.  Tumescent anesthesia was then created with a dilute lidocaine solution.  Laser energy was then delivered with constant withdrawal of the sheath and laser fiber.  Approximately 1730 joules of energy were delivered over a length of 40 cm.  Sterile dressings were placed.  The patient tolerated the procedure well without complications.

## 2019-07-02 ENCOUNTER — Other Ambulatory Visit (INDEPENDENT_AMBULATORY_CARE_PROVIDER_SITE_OTHER): Payer: Self-pay | Admitting: Vascular Surgery

## 2019-07-02 DIAGNOSIS — I872 Venous insufficiency (chronic) (peripheral): Secondary | ICD-10-CM

## 2019-07-05 ENCOUNTER — Ambulatory Visit (INDEPENDENT_AMBULATORY_CARE_PROVIDER_SITE_OTHER): Payer: Managed Care, Other (non HMO)

## 2019-07-05 ENCOUNTER — Other Ambulatory Visit: Payer: Self-pay

## 2019-07-05 DIAGNOSIS — I872 Venous insufficiency (chronic) (peripheral): Secondary | ICD-10-CM

## 2020-01-07 ENCOUNTER — Other Ambulatory Visit: Payer: Self-pay | Admitting: Orthopedic Surgery

## 2020-01-07 DIAGNOSIS — M2392 Unspecified internal derangement of left knee: Secondary | ICD-10-CM

## 2020-01-07 DIAGNOSIS — S8392XA Sprain of unspecified site of left knee, initial encounter: Secondary | ICD-10-CM

## 2020-01-27 ENCOUNTER — Ambulatory Visit
Admission: RE | Admit: 2020-01-27 | Discharge: 2020-01-27 | Disposition: A | Discharge status: Home / Self Care | Payer: Managed Care, Other (non HMO) | Source: Ambulatory Visit | Attending: Orthopedic Surgery | Admitting: Orthopedic Surgery

## 2020-01-27 DIAGNOSIS — M2392 Unspecified internal derangement of left knee: Secondary | ICD-10-CM

## 2020-01-27 DIAGNOSIS — S8392XA Sprain of unspecified site of left knee, initial encounter: Secondary | ICD-10-CM

## 2020-02-08 ENCOUNTER — Other Ambulatory Visit: Payer: Self-pay | Admitting: Orthopedic Surgery

## 2020-02-09 ENCOUNTER — Other Ambulatory Visit: Payer: Self-pay

## 2020-02-09 ENCOUNTER — Other Ambulatory Visit
Admission: RE | Admit: 2020-02-09 | Discharge: 2020-02-09 | Disposition: A | Discharge status: Home / Self Care | Payer: Managed Care, Other (non HMO) | Source: Ambulatory Visit | Attending: Orthopedic Surgery | Admitting: Orthopedic Surgery

## 2020-02-09 ENCOUNTER — Encounter: Payer: Self-pay | Admitting: Orthopedic Surgery

## 2020-02-09 DIAGNOSIS — Z20822 Contact with and (suspected) exposure to covid-19: Secondary | ICD-10-CM | POA: Diagnosis not present

## 2020-02-09 DIAGNOSIS — Z01812 Encounter for preprocedural laboratory examination: Secondary | ICD-10-CM | POA: Diagnosis present

## 2020-02-10 LAB — SARS CORONAVIRUS 2 (TAT 6-24 HRS): SARS Coronavirus 2: NEGATIVE

## 2020-02-11 ENCOUNTER — Encounter
Admission: RE | Disposition: A | Discharge status: Home / Self Care | Payer: Self-pay | Source: Home / Self Care | Attending: Orthopedic Surgery

## 2020-02-11 ENCOUNTER — Ambulatory Visit: Payer: Managed Care, Other (non HMO) | Admitting: Anesthesiology

## 2020-02-11 ENCOUNTER — Other Ambulatory Visit: Payer: Self-pay

## 2020-02-11 ENCOUNTER — Ambulatory Visit
Admission: RE | Admit: 2020-02-11 | Discharge: 2020-02-11 | Disposition: A | Discharge status: Home / Self Care | Payer: Managed Care, Other (non HMO) | Attending: Orthopedic Surgery | Admitting: Orthopedic Surgery

## 2020-02-11 DIAGNOSIS — M659 Synovitis and tenosynovitis, unspecified: Secondary | ICD-10-CM | POA: Diagnosis not present

## 2020-02-11 DIAGNOSIS — M1712 Unilateral primary osteoarthritis, left knee: Secondary | ICD-10-CM | POA: Insufficient documentation

## 2020-02-11 DIAGNOSIS — S83242A Other tear of medial meniscus, current injury, left knee, initial encounter: Secondary | ICD-10-CM | POA: Insufficient documentation

## 2020-02-11 DIAGNOSIS — I10 Essential (primary) hypertension: Secondary | ICD-10-CM | POA: Diagnosis not present

## 2020-02-11 DIAGNOSIS — X501XXA Overexertion from prolonged static or awkward postures, initial encounter: Secondary | ICD-10-CM | POA: Insufficient documentation

## 2020-02-11 DIAGNOSIS — M1990 Unspecified osteoarthritis, unspecified site: Secondary | ICD-10-CM | POA: Insufficient documentation

## 2020-02-11 DIAGNOSIS — Z79899 Other long term (current) drug therapy: Secondary | ICD-10-CM | POA: Diagnosis not present

## 2020-02-11 DIAGNOSIS — G4730 Sleep apnea, unspecified: Secondary | ICD-10-CM | POA: Insufficient documentation

## 2020-02-11 HISTORY — DX: Anemia, unspecified: D64.9

## 2020-02-11 HISTORY — DX: Unspecified osteoarthritis, unspecified site: M19.90

## 2020-02-11 HISTORY — PX: KNEE ARTHROSCOPY WITH MEDIAL MENISECTOMY: SHX5651

## 2020-02-11 SURGERY — ARTHROSCOPY, KNEE, WITH MEDIAL MENISCECTOMY
Anesthesia: General | Site: Knee | Laterality: Left

## 2020-02-11 MED ORDER — LACTATED RINGERS IV SOLN: INTRAVENOUS | Status: DC

## 2020-02-11 MED ORDER — ACETAMINOPHEN 500 MG PO TABS: 1000.0000 mg | ORAL_TABLET | Freq: Three times a day (TID) | ORAL | 2 refills | Status: AC

## 2020-02-11 MED ORDER — ONDANSETRON HCL 4 MG/2ML IJ SOLN
INTRAMUSCULAR | Status: DC | PRN
  Administered 2020-02-11: 4 mg via INTRAVENOUS

## 2020-02-11 MED ORDER — IBUPROFEN 800 MG PO TABS: 800.0000 mg | ORAL_TABLET | Freq: Three times a day (TID) | ORAL | 1 refills | Status: AC

## 2020-02-11 MED ORDER — DEXAMETHASONE SODIUM PHOSPHATE 4 MG/ML IJ SOLN
INTRAMUSCULAR | Status: DC | PRN
  Administered 2020-02-11: 4 mg via INTRAVENOUS

## 2020-02-11 MED ORDER — FENTANYL CITRATE (PF) 100 MCG/2ML IJ SOLN
INTRAMUSCULAR | Status: DC | PRN
  Administered 2020-02-11: 50 ug via INTRAVENOUS
  Administered 2020-02-11 (×2): 25 ug via INTRAVENOUS
  Administered 2020-02-11 (×2): 50 ug via INTRAVENOUS

## 2020-02-11 MED ORDER — MEPERIDINE HCL 25 MG/ML IJ SOLN: 6.2500 mg | INTRAMUSCULAR | Status: DC | PRN

## 2020-02-11 MED ORDER — PROPOFOL 10 MG/ML IV BOLUS
INTRAVENOUS | Status: DC | PRN
  Administered 2020-02-11: 130 mg via INTRAVENOUS

## 2020-02-11 MED ORDER — ASPIRIN EC 325 MG PO TBEC: 325.0000 mg | DELAYED_RELEASE_TABLET | Freq: Every day | ORAL | 0 refills | Status: AC

## 2020-02-11 MED ORDER — LIDOCAINE-EPINEPHRINE 1 %-1:100000 IJ SOLN
INTRAMUSCULAR | Status: DC | PRN
  Administered 2020-02-11: 15 mL via INTRAMUSCULAR

## 2020-02-11 MED ORDER — LIDOCAINE HCL (CARDIAC) PF 100 MG/5ML IV SOSY
PREFILLED_SYRINGE | INTRAVENOUS | Status: DC | PRN
  Administered 2020-02-11: 50 mg via INTRATRACHEAL

## 2020-02-11 MED ORDER — GLYCOPYRROLATE 0.2 MG/ML IJ SOLN
INTRAMUSCULAR | Status: DC | PRN
  Administered 2020-02-11: .1 mg via INTRAVENOUS

## 2020-02-11 MED ORDER — CEFAZOLIN SODIUM-DEXTROSE 2-4 GM/100ML-% IV SOLN
2.0000 g | INTRAVENOUS | Status: AC
  Administered 2020-02-11: 2 g via INTRAVENOUS

## 2020-02-11 MED ORDER — OXYCODONE HCL 5 MG PO TABS
5.0000 mg | ORAL_TABLET | Freq: Once | ORAL | Status: AC | PRN
  Administered 2020-02-11: 5 mg via ORAL

## 2020-02-11 MED ORDER — MIDAZOLAM HCL 5 MG/5ML IJ SOLN
INTRAMUSCULAR | Status: DC | PRN
  Administered 2020-02-11: 2 mg via INTRAVENOUS

## 2020-02-11 MED ORDER — ONDANSETRON 4 MG PO TBDP: 4.0000 mg | ORAL_TABLET | Freq: Three times a day (TID) | ORAL | 0 refills | Status: AC | PRN

## 2020-02-11 MED ORDER — OXYCODONE HCL 5 MG/5ML PO SOLN: 5.0000 mg | Freq: Once | ORAL | Status: AC | PRN

## 2020-02-11 MED ORDER — PROMETHAZINE HCL 25 MG/ML IJ SOLN: 6.2500 mg | INTRAMUSCULAR | Status: DC | PRN

## 2020-02-11 MED ORDER — HYDROMORPHONE HCL 1 MG/ML IJ SOLN
0.2500 mg | INTRAMUSCULAR | Status: DC | PRN
  Administered 2020-02-11 (×2): 0.25 mg via INTRAVENOUS

## 2020-02-11 MED ORDER — HYDROCODONE-ACETAMINOPHEN 5-325 MG PO TABS
1.0000 | ORAL_TABLET | ORAL | 0 refills | Status: AC | PRN
Start: 2020-02-11 — End: ?

## 2020-02-11 MED ORDER — KETOROLAC TROMETHAMINE 30 MG/ML IJ SOLN
INTRAMUSCULAR | Status: DC | PRN
  Administered 2020-02-11: 30 mg via INTRAVENOUS

## 2020-02-11 SURGICAL SUPPLY — 38 items
ADAPTER IRRIG TUBE 2 SPIKE SOL (ADAPTER) ×4 IMPLANT
ADPR TBG 2 SPK PMP STRL ASCP (ADAPTER) ×2
APL PRP STRL LF DISP 70% ISPRP (MISCELLANEOUS) ×1
BLADE SURG SZ11 CARB STEEL (BLADE) ×2 IMPLANT
BNDG COHESIVE 4X5 TAN STRL (GAUZE/BANDAGES/DRESSINGS) ×2 IMPLANT
BNDG ESMARK 6X12 TAN STRL LF (GAUZE/BANDAGES/DRESSINGS) ×2 IMPLANT
BUR RADIUS 3.5 (BURR) ×2 IMPLANT
CHLORAPREP W/TINT 26 (MISCELLANEOUS) ×2 IMPLANT
COOLER POLAR GLACIER W/PUMP (MISCELLANEOUS) ×2 IMPLANT
COVER LIGHT HANDLE UNIVERSAL (MISCELLANEOUS) ×4 IMPLANT
CUFF TOURN SGL QUICK 30 (TOURNIQUET CUFF) ×2
CUFF TRNQT CYL 30X4X21-28X (TOURNIQUET CUFF) ×1 IMPLANT
DRAPE IMP U-DRAPE 54X76 (DRAPES) ×2 IMPLANT
GAUZE SPONGE 4X4 12PLY STRL (GAUZE/BANDAGES/DRESSINGS) ×2 IMPLANT
GLOVE BIO SURGEON STRL SZ7.5 (GLOVE) ×2 IMPLANT
GLOVE BIOGEL PI IND STRL 8 (GLOVE) ×1 IMPLANT
GLOVE BIOGEL PI INDICATOR 8 (GLOVE) ×1
GOWN STRL REUS W/ TWL LRG LVL3 (GOWN DISPOSABLE) ×1 IMPLANT
GOWN STRL REUS W/TWL LRG LVL3 (GOWN DISPOSABLE) ×2
GOWN STRL REUS W/TWL XL LVL3 (GOWN DISPOSABLE) ×2 IMPLANT
IV LACTATED RINGER IRRG 3000ML (IV SOLUTION) ×8
IV LR IRRIG 3000ML ARTHROMATIC (IV SOLUTION) ×4 IMPLANT
KIT TURNOVER KIT A (KITS) ×2 IMPLANT
MANIFOLD NEPTUNE II (INSTRUMENTS) ×2 IMPLANT
MAT ABSORB  FLUID 56X50 GRAY (MISCELLANEOUS) ×1
MAT ABSORB FLUID 56X50 GRAY (MISCELLANEOUS) ×1 IMPLANT
PACK ARTHROSCOPY KNEE (MISCELLANEOUS) ×2 IMPLANT
PAD WRAPON POLAR KNEE (MISCELLANEOUS) ×1 IMPLANT
PADDING CAST BLEND 6X4 STRL (MISCELLANEOUS) ×1 IMPLANT
PADDING STRL CAST 6IN (MISCELLANEOUS) ×1
SET TUBE SUCT SHAVER OUTFL 24K (TUBING) ×2 IMPLANT
SET TUBE TIP INTRA-ARTICULAR (MISCELLANEOUS) ×2 IMPLANT
SUT ETHILON 3-0 FS-10 30 BLK (SUTURE) ×2
SUTURE EHLN 3-0 FS-10 30 BLK (SUTURE) ×1 IMPLANT
TOWEL OR 17X26 4PK STRL BLUE (TOWEL DISPOSABLE) ×4 IMPLANT
TUBING ARTHRO INFLOW-ONLY STRL (TUBING) ×2 IMPLANT
WAND WEREWOLF FLOW 90D (MISCELLANEOUS) ×2 IMPLANT
WRAPON POLAR PAD KNEE (MISCELLANEOUS) ×2

## 2020-02-11 NOTE — H&P (Signed)
Paper H&P to be scanned into permanent record. H&P reviewed. No significant changes noted.  

## 2020-02-11 NOTE — Transfer of Care (Signed)
Immediate Anesthesia Transfer of Care Note  Patient: Annette Aguilar  Procedure(s) Performed: Left knee arthroscopic partial medial meniscectomy (Left Knee)  Patient Location: PACU  Anesthesia Type: General  Level of Consciousness: awake, alert  and patient cooperative  Airway and Oxygen Therapy: Patient Spontanous Breathing and Patient connected to supplemental oxygen  Post-op Assessment: Post-op Vital signs reviewed, Patient's Cardiovascular Status Stable, Respiratory Function Stable, Patent Airway and No signs of Nausea or vomiting  Post-op Vital Signs: Reviewed and stable  Complications: No complications documented.

## 2020-02-11 NOTE — Discharge Instructions (Signed)
Arthroscopic Knee Surgery - Partial Meniscectomy   Post-Op Instructions   1. Bracing or crutches: Crutches will be provided at the time of discharge from the surgery center if you do not already have them.   2. Ice: You may be provided with a device (Polar Care) that allows you to ice the affected area effectively. Otherwise you can ice manually.    3. Driving:  Plan on not driving for at least two weeks. Please note that you are advised NOT to drive while taking narcotic pain medications as you may be impaired and unsafe to drive.   4. Activity: Ankle pumps several times an hour while awake to prevent blood clots. Weight bearing: as tolerated. Use crutches for as needed (usually ~1 week or less) until pain allows you to ambulate without a limp. Bending and straightening the knee is unlimited. Elevate knee above heart level as much as possible for one week. Avoid standing more than 5 minutes (consecutively) for the first week.  Avoid long distance travel for 2 weeks.  5. Medications:  - You have been provided a prescription for narcotic pain medicine. After surgery, take 1-2 narcotic tablets every 4 hours if needed for severe pain.  - You may take up to 3000mg/day of tylenol (acetaminophen). You can take 1000mg 3x/day. Please check your narcotic. If you have acetaminophen in your narcotic (each tablet will be 325mg), be careful not to exceed a total of 3000mg/day of acetaminophen.  - A prescription for anti-nausea medication will be provided in case the narcotic medicine or anesthesia causes nausea - take 1 tablet every 6 hours only if nauseated.  - Take ibuprofen 800 mg every 8 hours WITH food to reduce post-operative knee swelling. DO NOT STOP IBUPROFEN POST-OP UNTIL INSTRUCTED TO DO SO at first post-op office visit (10-14 days after surgery). However, please discontinue if you have any abdominal discomfort after taking this.  - Take enteric coated aspirin 325 mg once daily for 2 weeks to prevent  blood clots.    6. Bandages: The physical therapist should change the bandages at the first post-op appointment. If needed, the dressing supplies have been provided to you.   7. Physical Therapy: 1-2 times per week for 6 weeks. Therapy typically starts on post operative Day 3 or 4. You have been provided an order for physical therapy. The therapist will provide home exercises.   8. Work: May return to full work usually around 2 weeks after 1st post-operative visit. May do light duty/desk job in approximately 1-2 weeks when off of narcotics, pain is well-controlled, and swelling has decreased. Labor intensive jobs may require 4-6 weeks to return.      9. Post-Op Appointments: Your first post-op appointment will be with Dr. Jae Bruck in approximately 2 weeks time.    If you find that they have not been scheduled please call the Orthopaedic Appointment front desk at [phone].     General Anesthesia, Adult, Care After This sheet gives you information about how to care for yourself after your procedure. Your health care provider may also give you more specific instructions. If you have problems or questions, contact your health care provider. What can I expect after the procedure? After the procedure, the following side effects are common:  Pain or discomfort at the IV site.  Nausea.  Vomiting.  Sore throat.  Trouble concentrating.  Feeling cold or chills.  Weak or tired.  Sleepiness and fatigue.  Soreness and body aches. These side effects can affect parts   of the body that were not involved in surgery. Follow these instructions at home:  For at least 24 hours after the procedure:  Have a responsible adult stay with you. It is important to have someone help care for you until you are awake and alert.  Rest as needed.  Do not: ? Participate in activities in which you could fall or become injured. ? Drive. ? Use heavy machinery. ? Drink alcohol. ? Take sleeping pills or  medicines that cause drowsiness. ? Make important decisions or sign legal documents. ? Take care of children on your own. Eating and drinking  Follow any instructions from your health care provider about eating or drinking restrictions.  When you feel hungry, start by eating small amounts of foods that are soft and easy to digest (bland), such as toast. Gradually return to your regular diet.  Drink enough fluid to keep your urine pale yellow.  If you vomit, rehydrate by drinking water, juice, or clear broth. General instructions  If you have sleep apnea, surgery and certain medicines can increase your risk for breathing problems. Follow instructions from your health care provider about wearing your sleep device: ? Anytime you are sleeping, including during daytime naps. ? While taking prescription pain medicines, sleeping medicines, or medicines that make you drowsy.  Return to your normal activities as told by your health care provider. Ask your health care provider what activities are safe for you.  Take over-the-counter and prescription medicines only as told by your health care provider.  If you smoke, do not smoke without supervision.  Keep all follow-up visits as told by your health care provider. This is important. Contact a health care provider if:  You have nausea or vomiting that does not get better with medicine.  You cannot eat or drink without vomiting.  You have pain that does not get better with medicine.  You are unable to pass urine.  You develop a skin rash.  You have a fever.  You have redness around your IV site that gets worse. Get help right away if:  You have difficulty breathing.  You have chest pain.  You have blood in your urine or stool, or you vomit blood. Summary  After the procedure, it is common to have a sore throat or nausea. It is also common to feel tired.  Have a responsible adult stay with you for the first 24 hours after general  anesthesia. It is important to have someone help care for you until you are awake and alert.  When you feel hungry, start by eating small amounts of foods that are soft and easy to digest (bland), such as toast. Gradually return to your regular diet.  Drink enough fluid to keep your urine pale yellow.  Return to your normal activities as told by your health care provider. Ask your health care provider what activities are safe for you. This information is not intended to replace advice given to you by your health care provider. Make sure you discuss any questions you have with your health care provider. Document Revised: 04/11/2017 Document Reviewed: 11/22/2016 Elsevier Patient Education  2020 [employer]  

## 2020-02-11 NOTE — Anesthesia Procedure Notes (Signed)
Procedure Name: LMA Insertion Date/Time: 02/11/2020 10:30 AM Performed by: Mayme Genta, CRNA Pre-anesthesia Checklist: Patient identified, Emergency Drugs available, Suction available, Timeout performed and Patient being monitored Patient Re-evaluated:Patient Re-evaluated prior to induction Oxygen Delivery Method: Circle system utilized Preoxygenation: Pre-oxygenation with 100% oxygen Induction Type: IV induction LMA: LMA inserted LMA Size: 4.0 Number of attempts: 1 Placement Confirmation: positive ETCO2 and breath sounds checked- equal and bilateral Tube secured with: Tape

## 2020-02-11 NOTE — Op Note (Signed)
Operative Note    SURGERY DATE: 02/11/2020   PRE-OP DIAGNOSIS:  1. Left medial meniscus tear 2. Left tricompartmental degenerative changes   POST-OP DIAGNOSIS:  1. Left medial meniscus tear 2. Left tricompartmental degenerative changes 3.  Left knee synovitis   PROCEDURES:  1.  Left knee arthroscopy, partial medial meniscectomy 2.  Left knee partial synovectomy 3.  Left knee arthroscopic chondroplasty of medial compartment   SURGEON: Cato Mulligan, MD   ANESTHESIA: Gen   ESTIMATED BLOOD LOSS: minimal   TOTAL IV FLUIDS: per anesthesia   INDICATION(S):  [name] is a 54 y.o. female with signs and symptoms as well as MRI finding of medial meniscus tear.  Symptoms began after a twisting injury to the knee approximately 2 months ago.  She had failed nonoperative management including physical therapy, corticosteroid injections, and medications.  After discussion of risks, benefits, and alternatives to surgery, the patient elected to proceed.   OPERATIVE FINDINGS:    Examination under anesthesia: A careful examination under anesthesia was performed.  Passive range of motion was: Hyperextension: 1.  Extension: 0.  Flexion: 120.  Lachman: normal. Pivot Shift: normal.  Posterior drawer: normal.  Varus stability in full extension: normal.  Varus stability in 30 degrees of flexion: normal.  Valgus stability in full extension: normal.  Valgus stability in 30 degrees of flexion: normal.   Intra-operative findings: A thorough arthroscopic examination of the knee was performed.  The findings are: 1. Suprapatellar pouch: Mild synovitis 2. Undersurface of median ridge: Grade 1 softening 3. Medial patellar facet: Grade 1 softening 4. Lateral patellar facet: Grade 2 degenerative changes 5. Trochlea: Focal chondral defect of the central trochlea measuring approximately 6 mm x 12 mm 6. Lateral gutter/popliteus tendon: Normal 7. Hoffa's fat pad: Inflamed 8. Medial gutter/plica: Normal 9.  ACL: Normal, small cystic structure anterior and lateral to tibial insertion adjacent to anterior horn lateral meniscus 10. PCL: Normal 11. Medial meniscus: Horizontal/oblique tear of the posterior horn extending from the posterior horn meniscus root to the posterior horn/body junction 12. Medial compartment cartilage: Focal areas of grade 4 degenerative to the medial femoral condyle, focal area of grade 3 degenerative change to the central/anterior tibial plateau 13. Lateral meniscus: Normal 14. Lateral compartment cartilage: Grade 2 degenerative changes to the tibial plateau, normal lateral femoral condyle   OPERATIVE REPORT:     I identified Annette Aguilar in the pre-operative holding area. I marked the operative knee with my initials. I reviewed the risks and benefits of the proposed surgical intervention and the patient wished to proceed. The patient was transferred to the operative suite and placed in the supine position with all bony prominences padded.  Anesthesia was administered. Appropriate IV antibiotics were administered prior to incision. The extremity was then prepped and draped in standard fashion. A time out was performed confirming the correct extremity, correct patient, and correct procedure.   Arthroscopy portals were marked. Local anesthetic was injected to the planned portal sites. The anterolateral portal was established with an 11 blade.      The arthroscope was placed in the anterolateral portal and then into the suprapatellar pouch. Next, the medial portal was established under needle localization. A diagnostic knee scope was completed with the above findings. The medial meniscus tear was identified.   The MCL was pie-crusted to improve visualization of the posterior horn. The meniscal tear was debrided using an arthroscopic biter and an oscillating shaver until the meniscus had stable borders. A chondroplasty was  performed of the medial compartment such that there were  stable cartilage edges without any loose fragments of cartilage.  The cystic structure adjacent to the ACL and lateral meniscus was debrided using an oscillating shaver.   Lastly, a partial synovectomy of the patellofemoral space was performed given the significant amount of synovitis in this region.  This was performed using combination of an oscillating shaver and ArthroCare wand.  Arthroscopic fluid was then removed from the joint.   The portals were closed with 3-0 Nylon suture. Sterile dressings included Xeroform, 4x4s, Sof-Rol, and Bias wrap. A Polarcare was placed.  The patient was then awakened and taken to the PACU hemodynamically stable without complication.   POSTOPERATIVE PLAN: The patient will be discharged home today once they meet PACU criteria. Aspirin 325 mg daily was prescribed for 2 weeks for DVT prophylaxis.  Physical therapy will start on POD#3-4. Weight-bearing as tolerated. Follow up in 2 weeks per protocol.

## 2020-02-11 NOTE — Anesthesia Preprocedure Evaluation (Addendum)
Anesthesia Evaluation  Patient identified by MRN, date of birth, ID band Patient awake    Reviewed: Allergy & Precautions, NPO status , Patient's Chart, lab work & pertinent test results, reviewed documented beta blocker date and time   History of Anesthesia Complications Negative for: history of anesthetic complications  Airway Mallampati: II  TM Distance: >3 FB Neck ROM: Full    Dental   Pulmonary sleep apnea ,    breath sounds clear to auscultation       Cardiovascular hypertension, (-) angina(-) DOE  Rhythm:Regular Rate:Normal     Neuro/Psych    GI/Hepatic GERD  Controlled,  Endo/Other  Morbid obesity (BMI 41)  Renal/GU      Musculoskeletal  (+) Arthritis ,   Abdominal   Peds  Hematology  (+) anemia ,   Anesthesia Other Findings   Reproductive/Obstetrics                            Anesthesia Physical Anesthesia Plan  ASA: III  Anesthesia Plan: General   Post-op Pain Management:    Induction: Intravenous  PONV Risk Score and Plan: 3 and Treatment may vary due to age or medical condition, Ondansetron, Dexamethasone and Midazolam  Airway Management Planned: LMA  Additional Equipment:   Intra-op Plan:   Post-operative Plan: Extubation in OR  Informed Consent: I have reviewed the patients History and Physical, chart, labs and discussed the procedure including the risks, benefits and alternatives for the proposed anesthesia with the patient or authorized representative who has indicated his/her understanding and acceptance.       Plan Discussed with: CRNA and Anesthesiologist  Anesthesia Plan Comments:         Anesthesia Quick Evaluation

## 2020-02-11 NOTE — Anesthesia Postprocedure Evaluation (Signed)
Anesthesia Post Note  Patient: Annette Aguilar  Procedure(s) Performed: Left knee arthroscopic partial medial meniscectomy (Left Knee)     Patient location during evaluation: PACU Anesthesia Type: General Level of consciousness: awake and alert Pain management: pain level controlled Vital Signs Assessment: post-procedure vital signs reviewed and stable Respiratory status: spontaneous breathing, nonlabored ventilation, respiratory function stable and patient connected to nasal cannula oxygen Cardiovascular status: blood pressure returned to baseline and stable Postop Assessment: no apparent nausea or vomiting Anesthetic complications: no   No complications documented.  Breelyn Icard A  Maricarmen Braziel

## 2020-02-14 ENCOUNTER — Encounter: Payer: Self-pay | Admitting: Orthopedic Surgery

## 2020-02-15 ENCOUNTER — Ambulatory Visit
Admission: RE | Admit: 2020-02-15 | Discharge: 2020-02-15 | Disposition: A | Discharge status: Home / Self Care | Payer: Managed Care, Other (non HMO) | Source: Ambulatory Visit | Attending: Obstetrics and Gynecology | Admitting: Obstetrics and Gynecology

## 2020-02-15 ENCOUNTER — Other Ambulatory Visit: Payer: Self-pay

## 2020-02-15 ENCOUNTER — Other Ambulatory Visit: Payer: Self-pay | Admitting: Obstetrics and Gynecology

## 2020-02-15 DIAGNOSIS — Z1231 Encounter for screening mammogram for malignant neoplasm of breast: Secondary | ICD-10-CM | POA: Insufficient documentation

## 2022-02-18 ENCOUNTER — Encounter (INDEPENDENT_AMBULATORY_CARE_PROVIDER_SITE_OTHER): Payer: Self-pay
